# Patient Record
Sex: Female | Born: 1940 | Race: White | Hispanic: No | Marital: Married | Smoking: Never smoker
Health system: Southern US, Community
[De-identification: ages and names within clinical notes are randomized; demographics above are authoritative.]

## PROBLEM LIST (undated history)

## (undated) DIAGNOSIS — M8580 Other specified disorders of bone density and structure, unspecified site: Secondary | ICD-10-CM

## (undated) DIAGNOSIS — M797 Fibromyalgia: Secondary | ICD-10-CM

## (undated) DIAGNOSIS — R011 Cardiac murmur, unspecified: Secondary | ICD-10-CM

## (undated) DIAGNOSIS — E7800 Pure hypercholesterolemia, unspecified: Secondary | ICD-10-CM

## (undated) DIAGNOSIS — C439 Malignant melanoma of skin, unspecified: Secondary | ICD-10-CM

## (undated) HISTORY — PX: CATARACT EXTRACTION: SUR2

## (undated) HISTORY — DX: Pure hypercholesterolemia, unspecified: E78.00

## (undated) HISTORY — PX: ABDOMINAL HYSTERECTOMY: SHX81

## (undated) HISTORY — DX: Other specified disorders of bone density and structure, unspecified site: M85.80

## (undated) HISTORY — DX: Cardiac murmur, unspecified: R01.1

## (undated) HISTORY — PX: OTHER SURGICAL HISTORY: SHX169

## (undated) HISTORY — PX: MELANOMA EXCISION: SHX5266

---

## 1998-11-06 ENCOUNTER — Ambulatory Visit (HOSPITAL_COMMUNITY): Admission: RE | Admit: 1998-11-06 | Discharge: 1998-11-06 | Payer: Self-pay | Admitting: Gynecology

## 1998-12-21 ENCOUNTER — Inpatient Hospital Stay (HOSPITAL_COMMUNITY): Admission: RE | Admit: 1998-12-21 | Discharge: 1998-12-23 | Payer: Self-pay | Admitting: Gynecology

## 1999-04-14 ENCOUNTER — Emergency Department (HOSPITAL_COMMUNITY): Admission: EM | Admit: 1999-04-14 | Discharge: 1999-04-14 | Payer: Self-pay | Admitting: Emergency Medicine

## 1999-12-20 ENCOUNTER — Other Ambulatory Visit: Admission: RE | Admit: 1999-12-20 | Discharge: 1999-12-20 | Payer: Self-pay | Admitting: Gynecology

## 2000-01-07 ENCOUNTER — Encounter: Admission: RE | Admit: 2000-01-07 | Discharge: 2000-01-07 | Payer: Self-pay | Admitting: Gynecology

## 2000-01-07 ENCOUNTER — Encounter: Payer: Self-pay | Admitting: Gynecology

## 2001-01-04 ENCOUNTER — Other Ambulatory Visit: Admission: RE | Admit: 2001-01-04 | Discharge: 2001-01-04 | Payer: Self-pay | Admitting: Gynecology

## 2001-01-08 ENCOUNTER — Encounter: Payer: Self-pay | Admitting: Gynecology

## 2001-01-08 ENCOUNTER — Encounter: Admission: RE | Admit: 2001-01-08 | Discharge: 2001-01-08 | Payer: Self-pay | Admitting: Gynecology

## 2001-09-11 ENCOUNTER — Ambulatory Visit (HOSPITAL_BASED_OUTPATIENT_CLINIC_OR_DEPARTMENT_OTHER): Admission: RE | Admit: 2001-09-11 | Discharge: 2001-09-11 | Payer: Self-pay | Admitting: General Surgery

## 2002-01-31 ENCOUNTER — Encounter: Payer: Self-pay | Admitting: Gynecology

## 2002-01-31 ENCOUNTER — Encounter: Admission: RE | Admit: 2002-01-31 | Discharge: 2002-01-31 | Payer: Self-pay | Admitting: Gynecology

## 2002-12-25 ENCOUNTER — Other Ambulatory Visit: Admission: RE | Admit: 2002-12-25 | Discharge: 2002-12-25 | Payer: Self-pay | Admitting: Gynecology

## 2004-02-13 ENCOUNTER — Encounter: Admission: RE | Admit: 2004-02-13 | Discharge: 2004-02-13 | Payer: Self-pay | Admitting: Gynecology

## 2005-04-09 ENCOUNTER — Emergency Department (HOSPITAL_COMMUNITY): Admission: EM | Admit: 2005-04-09 | Discharge: 2005-04-09 | Payer: Self-pay | Admitting: Emergency Medicine

## 2008-07-29 ENCOUNTER — Encounter: Admission: RE | Admit: 2008-07-29 | Discharge: 2008-07-29 | Payer: Self-pay | Admitting: Family Medicine

## 2009-08-18 ENCOUNTER — Encounter: Admission: RE | Admit: 2009-08-18 | Discharge: 2009-08-18 | Payer: Self-pay | Admitting: Family Medicine

## 2010-09-09 ENCOUNTER — Encounter
Admission: RE | Admit: 2010-09-09 | Discharge: 2010-09-09 | Payer: Self-pay | Source: Home / Self Care | Attending: Family Medicine | Admitting: Family Medicine

## 2011-09-14 ENCOUNTER — Other Ambulatory Visit: Payer: Self-pay | Admitting: Family Medicine

## 2011-09-14 DIAGNOSIS — Z1231 Encounter for screening mammogram for malignant neoplasm of breast: Secondary | ICD-10-CM

## 2011-09-28 ENCOUNTER — Ambulatory Visit
Admission: RE | Admit: 2011-09-28 | Discharge: 2011-09-28 | Disposition: A | Discharge status: Home / Self Care | Payer: Medicare Other | Source: Ambulatory Visit | Attending: Family Medicine | Admitting: Family Medicine

## 2011-09-28 DIAGNOSIS — Z1231 Encounter for screening mammogram for malignant neoplasm of breast: Secondary | ICD-10-CM

## 2012-09-02 ENCOUNTER — Emergency Department (HOSPITAL_BASED_OUTPATIENT_CLINIC_OR_DEPARTMENT_OTHER)
Admission: EM | Admit: 2012-09-02 | Discharge: 2012-09-03 | Disposition: A | Discharge status: Home / Self Care | Payer: Medicare Other | Attending: Emergency Medicine | Admitting: Emergency Medicine

## 2012-09-02 ENCOUNTER — Emergency Department (HOSPITAL_BASED_OUTPATIENT_CLINIC_OR_DEPARTMENT_OTHER): Payer: Medicare Other

## 2012-09-02 ENCOUNTER — Encounter (HOSPITAL_BASED_OUTPATIENT_CLINIC_OR_DEPARTMENT_OTHER): Payer: Self-pay | Admitting: *Deleted

## 2012-09-02 DIAGNOSIS — J3489 Other specified disorders of nose and nasal sinuses: Secondary | ICD-10-CM | POA: Insufficient documentation

## 2012-09-02 DIAGNOSIS — J069 Acute upper respiratory infection, unspecified: Secondary | ICD-10-CM

## 2012-09-02 DIAGNOSIS — IMO0001 Reserved for inherently not codable concepts without codable children: Secondary | ICD-10-CM | POA: Insufficient documentation

## 2012-09-02 DIAGNOSIS — R5381 Other malaise: Secondary | ICD-10-CM | POA: Insufficient documentation

## 2012-09-02 DIAGNOSIS — R1110 Vomiting, unspecified: Secondary | ICD-10-CM | POA: Insufficient documentation

## 2012-09-02 DIAGNOSIS — B349 Viral infection, unspecified: Secondary | ICD-10-CM

## 2012-09-02 DIAGNOSIS — R112 Nausea with vomiting, unspecified: Secondary | ICD-10-CM | POA: Insufficient documentation

## 2012-09-02 DIAGNOSIS — B9789 Other viral agents as the cause of diseases classified elsewhere: Secondary | ICD-10-CM | POA: Insufficient documentation

## 2012-09-02 DIAGNOSIS — Z85820 Personal history of malignant melanoma of skin: Secondary | ICD-10-CM | POA: Insufficient documentation

## 2012-09-02 HISTORY — DX: Fibromyalgia: M79.7

## 2012-09-02 HISTORY — DX: Malignant melanoma of skin, unspecified: C43.9

## 2012-09-02 LAB — CBC WITH DIFFERENTIAL/PLATELET
Basophils Absolute: 0 10*3/uL (ref 0.0–0.1)
HCT: 38.1 % (ref 36.0–46.0)
Lymphocytes Relative: 5 % — ABNORMAL LOW (ref 12–46)
Monocytes Relative: 8 % (ref 3–12)
Neutro Abs: 13.7 10*3/uL — ABNORMAL HIGH (ref 1.7–7.7)
Platelets: 204 10*3/uL (ref 150–400)
RDW: 12.3 % (ref 11.5–15.5)
WBC: 15.8 10*3/uL — ABNORMAL HIGH (ref 4.0–10.5)

## 2012-09-02 LAB — BASIC METABOLIC PANEL
GFR calc Af Amer: >90 mL/min (ref >90)
GFR calc non Af Amer: 88 mL/min — ABNORMAL LOW (ref >90)
Potassium: 4.2 mEq/L (ref 3.5–5.1)
Sodium: 135 mEq/L (ref 135–145)

## 2012-09-02 MED ORDER — SODIUM CHLORIDE 0.9 % IV BOLUS (SEPSIS)
500.0000 mL | Freq: Once | INTRAVENOUS | Status: AC
  Administered 2012-09-02: 500 mL via INTRAVENOUS

## 2012-09-02 MED ORDER — SODIUM CHLORIDE 0.9 % IV SOLN: INTRAVENOUS | Status: DC

## 2012-09-02 MED ORDER — ONDANSETRON 4 MG PO TBDP: 4.0000 mg | ORAL_TABLET | Freq: Three times a day (TID) | ORAL | Status: DC | PRN

## 2012-09-02 MED ORDER — MECLIZINE HCL 25 MG PO TABS: 25.0000 mg | ORAL_TABLET | Freq: Four times a day (QID) | ORAL | Status: DC

## 2012-09-02 MED ORDER — MECLIZINE HCL 25 MG PO TABS
25.0000 mg | ORAL_TABLET | Freq: Once | ORAL | Status: AC
  Administered 2012-09-02: 25 mg via ORAL
  Filled 2012-09-02: qty 1

## 2012-09-02 MED ORDER — OSELTAMIVIR PHOSPHATE 75 MG PO CAPS: 75.0000 mg | ORAL_CAPSULE | Freq: Two times a day (BID) | ORAL | Status: DC

## 2012-09-02 NOTE — ED Notes (Signed)
Urine specimen requested 

## 2012-09-02 NOTE — ED Provider Notes (Addendum)
History  This chart was scribed for Shelda Jakes, MD by Shari Heritage, ED Scribe. The patient was seen in room MH06/MH06. Patient's care was started at 2113.  CSN: [id number]  Arrival date & time 09/02/12  1901   First MD Initiated Contact with Patient 09/02/12 2113      Chief Complaint  Patient presents with  . Cough  . Emesis    Patient is a 72 y.o. female presenting with cough. The history is provided by the patient. No language interpreter was used.  Cough This is a new problem. The current episode started more than 2 days ago. The problem occurs constantly. The problem has been gradually improving. The cough is non-productive. There has been no fever. Associated symptoms include rhinorrhea. Pertinent negatives include no chest pain, no sore throat and no shortness of breath.    HPI Comments: Whitney West is a 72 y.o. female who presents to the Emergency Department complaining of gradually worsening, non-productive cough onset 5 days ago. There is associated rhinorrhea, congestion, nausea, emesis (x1) and fatigue. Patient states that today she started to "feel worse" and felt "worn out completely. Patient began vomiting today in triage. Patient denies fever, diarrhea, abdominal pain, body aches, rash, dysuria, neck pain, back pain, bleeding easily, or shortness of breath. Patient does not use oxygen at home. She did not have a flu shot this season. Patient has the medical history of melanoma and fibromyalgia. Patient does not smoke.   Past Medical History  Diagnosis Date  . Fibromyalgia   . Melanoma     Past Surgical History  Procedure Date  . Bunions   . Abdominal hysterectomy   . Melanoma excision     No family history on file.  History  Substance Use Topics  . Smoking status: Never Smoker   . Smokeless tobacco: Never Used  . Alcohol Use: No    OB History    Grav Para Term Preterm Abortions TAB SAB Ect Mult Living                  Review of Systems    Constitutional: Positive for fatigue. Negative for fever.  HENT: Positive for congestion and rhinorrhea. Negative for sore throat and neck pain.   Eyes: Negative for visual disturbance.  Respiratory: Positive for cough. Negative for shortness of breath.   Cardiovascular: Negative for chest pain.  Gastrointestinal: Positive for nausea. Negative for abdominal pain and diarrhea.  Genitourinary: Negative for dysuria.  Musculoskeletal: Negative for back pain.  Skin: Negative for rash.  Hematological: Does not bruise/bleed easily.    Allergies  Review of patient's allergies indicates no known allergies.  Home Medications   Current Outpatient Rx  Name  Route  Sig  Dispense  Refill  . ONE-DAILY MULTI VITAMINS PO TABS   Oral   Take 1 tablet by mouth daily.           Triage Vitals: BP 138/52  Pulse 61  Temp 97.6 F (36.4 C) (Oral)  Resp 20  Ht 5\' 6"  (1.676 m)  Wt 160 lb (72.576 kg)  BMI 25.82 kg/m2  SpO2 97%  Physical Exam  Constitutional: She is oriented to person, place, and time. She appears well-developed and well-nourished. No distress.  HENT:  Head: Normocephalic and atraumatic.  Mouth/Throat: Oropharynx is clear and moist. Mucous membranes are dry (mildly). No oropharyngeal exudate or posterior oropharyngeal erythema.  Eyes: Conjunctivae normal and EOM are normal. Pupils are equal, round, and reactive to light.  Neck: Neck supple.  Cardiovascular: Normal rate and regular rhythm.   No murmur heard. Pulmonary/Chest: Effort normal. No respiratory distress. She has no wheezes. She has rhonchi (bilateral). She has no rales.  Abdominal: Soft. Bowel sounds are normal. She exhibits no distension. There is no tenderness. There is no rebound and no guarding.  Musculoskeletal:       No swelling in ankles.  Lymphadenopathy:    She has no cervical adenopathy.  Neurological: She is alert and oriented to person, place, and time. No cranial nerve deficit.  Skin: Skin is warm and  dry. No rash noted.  Psychiatric: She has a normal mood and affect. Her behavior is normal.    ED Course  Procedures (including critical care time) DIAGNOSTIC STUDIES: Oxygen Saturation is 97% on room air, adequate by my interpretation.    COORDINATION OF CARE: 9:46 PM- Patient informed of current plan for treatment and evaluation and agrees with plan at this time.  Results for orders placed during the hospital encounter of 09/02/12  CBC WITH DIFFERENTIAL      Component Value Range   WBC 15.8 (*) 4.0 - 10.5 K/uL   RBC 4.14  3.87 - 5.11 MIL/uL   Hemoglobin 13.3  12.0 - 15.0 g/dL   HCT 81.1  91.4 - 78.2 %   MCV 92.0  78.0 - 100.0 fL   MCH 32.1  26.0 - 34.0 pg   MCHC 34.9  30.0 - 36.0 g/dL   RDW 95.6  21.3 - 08.6 %   Platelets 204  150 - 400 K/uL   Neutrophils Relative 87 (*) 43 - 77 %   Lymphocytes Relative 5 (*) 12 - 46 %   Monocytes Relative 8  3 - 12 %   Eosinophils Relative 0  0 - 5 %   Basophils Relative 0  0 - 1 %   Neutro Abs 13.7 (*) 1.7 - 7.7 K/uL   Lymphs Abs 0.8  0.7 - 4.0 K/uL   Monocytes Absolute 1.3 (*) 0.1 - 1.0 K/uL   Eosinophils Absolute 0.0  0.0 - 0.7 K/uL   Basophils Absolute 0.0  0.0 - 0.1 K/uL   WBC Morphology ATYPICAL LYMPHOCYTES     Smear Review PLATELET COUNT CONFIRMED BY SMEAR    BASIC METABOLIC PANEL      Component Value Range   Sodium 135  135 - 145 mEq/L   Potassium 4.2  3.5 - 5.1 mEq/L   Chloride 99  96 - 112 mEq/L   CO2 24  19 - 32 mEq/L   Glucose, Bld 150 (*) 70 - 99 mg/dL   BUN 17  6 - 23 mg/dL   Creatinine, Ser 5.78  0.50 - 1.10 mg/dL   Calcium 8.9  8.4 - 46.9 mg/dL   GFR calc non Af Amer 88 (*) >90 mL/min   GFR calc Af Amer >90  >90 mL/min  TROPONIN I      Component Value Range   Troponin I <0.30  <0.30 ng/mL    Date: 09/02/2012  Rate: 64  Rhythm: normal sinus rhythm  QRS Axis: normal  Intervals: normal  ST/T Wave abnormalities: nonspecific ST changes  Conduction Disutrbances:none  Narrative Interpretation:   Old EKG  Reviewed: none available     No results found.   1. Upper respiratory infection       MDM   Patient with upper respiratory type symptoms for the past 5 days associated with cough today started feeling worse more week some dizziness and  nausea and vomited once in triage no further vomiting no diarrhea. Patient with a leukocytosis no significant electrolyte abnormalities renal function is normal. EKG without acute changes troponin negative so no evidence of any acute cardiac event to explain the weakness. Chest x-ray is still pending which may be important in ruling out pneumonia. Urinalysis is still pending.  Chest x-ray negative for pneumonia. Patient now with some dizziness vertigo type symptoms. Will treat with Antivert patient's symptoms consistent with a viral process this may be the beginning of an early flu but no clear-cut flu symptoms at this point in time. Patient's note acute distress nontoxic can be discharged home close followup will be important or returning if she gets worse at all.    I personally performed the services described in this documentation, which was scribed in my presence. The recorded information has been reviewed and is accurate.     Shelda Jakes, MD 09/02/12 0981  Shelda Jakes, MD 09/02/12 [phone]

## 2012-09-02 NOTE — ED Notes (Signed)
Place on o2 at 2l for sat 91% on room air

## 2012-09-02 NOTE — ED Notes (Addendum)
Pt reports cough x 5 days- today c/o feeling weak, dizzy and nauseated- now vomiting in triage

## 2012-09-13 ENCOUNTER — Other Ambulatory Visit: Payer: Self-pay | Admitting: Family Medicine

## 2012-09-13 DIAGNOSIS — Z1231 Encounter for screening mammogram for malignant neoplasm of breast: Secondary | ICD-10-CM

## 2012-10-16 ENCOUNTER — Ambulatory Visit
Admission: RE | Admit: 2012-10-16 | Discharge: 2012-10-16 | Disposition: A | Discharge status: Home / Self Care | Payer: Medicare Other | Source: Ambulatory Visit | Attending: Family Medicine | Admitting: Family Medicine

## 2012-10-16 DIAGNOSIS — Z1231 Encounter for screening mammogram for malignant neoplasm of breast: Secondary | ICD-10-CM

## 2013-10-03 ENCOUNTER — Other Ambulatory Visit: Payer: Self-pay

## 2013-10-03 DIAGNOSIS — Z1231 Encounter for screening mammogram for malignant neoplasm of breast: Secondary | ICD-10-CM

## 2013-10-17 ENCOUNTER — Ambulatory Visit
Admission: RE | Admit: 2013-10-17 | Discharge: 2013-10-17 | Disposition: A | Discharge status: Home / Self Care | Payer: Medicare Other | Source: Ambulatory Visit

## 2013-10-17 DIAGNOSIS — Z1231 Encounter for screening mammogram for malignant neoplasm of breast: Secondary | ICD-10-CM

## 2014-01-08 ENCOUNTER — Other Ambulatory Visit: Payer: Self-pay | Admitting: Family Medicine

## 2014-01-08 DIAGNOSIS — M8580 Other specified disorders of bone density and structure, unspecified site: Secondary | ICD-10-CM

## 2014-01-17 ENCOUNTER — Ambulatory Visit
Admission: RE | Admit: 2014-01-17 | Discharge: 2014-01-17 | Disposition: A | Discharge status: Home / Self Care | Payer: Medicare Other | Source: Ambulatory Visit | Attending: Family Medicine | Admitting: Family Medicine

## 2014-01-17 DIAGNOSIS — M8580 Other specified disorders of bone density and structure, unspecified site: Secondary | ICD-10-CM

## 2014-09-23 ENCOUNTER — Other Ambulatory Visit: Payer: Self-pay

## 2014-09-23 DIAGNOSIS — Z1231 Encounter for screening mammogram for malignant neoplasm of breast: Secondary | ICD-10-CM

## 2014-10-20 ENCOUNTER — Ambulatory Visit
Admission: RE | Admit: 2014-10-20 | Discharge: 2014-10-20 | Disposition: A | Discharge status: Home / Self Care | Payer: Medicare Other | Source: Ambulatory Visit

## 2014-10-20 DIAGNOSIS — Z1231 Encounter for screening mammogram for malignant neoplasm of breast: Secondary | ICD-10-CM

## 2015-09-25 ENCOUNTER — Other Ambulatory Visit: Payer: Self-pay

## 2015-09-25 DIAGNOSIS — Z1231 Encounter for screening mammogram for malignant neoplasm of breast: Secondary | ICD-10-CM

## 2015-10-22 ENCOUNTER — Ambulatory Visit
Admission: RE | Admit: 2015-10-22 | Discharge: 2015-10-22 | Disposition: A | Discharge status: Home / Self Care | Payer: Medicare Other | Source: Ambulatory Visit

## 2015-10-22 DIAGNOSIS — Z1231 Encounter for screening mammogram for malignant neoplasm of breast: Secondary | ICD-10-CM

## 2016-09-22 ENCOUNTER — Other Ambulatory Visit: Payer: Self-pay | Admitting: Family Medicine

## 2016-09-22 DIAGNOSIS — Z1231 Encounter for screening mammogram for malignant neoplasm of breast: Secondary | ICD-10-CM

## 2016-10-24 ENCOUNTER — Ambulatory Visit
Admission: RE | Admit: 2016-10-24 | Discharge: 2016-10-24 | Disposition: A | Discharge status: Home / Self Care | Payer: Medicare Other | Source: Ambulatory Visit | Attending: Family Medicine | Admitting: Family Medicine

## 2016-10-24 DIAGNOSIS — Z1231 Encounter for screening mammogram for malignant neoplasm of breast: Secondary | ICD-10-CM

## 2017-05-23 ENCOUNTER — Other Ambulatory Visit: Payer: Self-pay | Admitting: Family Medicine

## 2017-05-23 DIAGNOSIS — M8580 Other specified disorders of bone density and structure, unspecified site: Secondary | ICD-10-CM

## 2017-05-24 ENCOUNTER — Telehealth: Payer: Self-pay

## 2017-05-24 NOTE — Telephone Encounter (Signed)
SENT NOTES TO SCHEDULING 

## 2017-05-25 ENCOUNTER — Ambulatory Visit
Admission: RE | Admit: 2017-05-25 | Discharge: 2017-05-25 | Disposition: A | Discharge status: Home / Self Care | Payer: Medicare Other | Source: Ambulatory Visit | Attending: Family Medicine | Admitting: Family Medicine

## 2017-05-25 DIAGNOSIS — M8580 Other specified disorders of bone density and structure, unspecified site: Secondary | ICD-10-CM

## 2017-05-29 NOTE — Telephone Encounter (Signed)
05/26/2017 Received faxed referral from [facility] for upcoming appointment with Dr.Jordan on 06/23/2017.  Records given to [facility].  cbr

## 2017-05-29 NOTE — Telephone Encounter (Deleted)
05/26/2017 Received fa

## 2017-06-08 ENCOUNTER — Encounter: Payer: Self-pay | Admitting: *Deleted

## 2017-06-18 NOTE — Progress Notes (Signed)
Cardiology Office Note   Date:  06/23/2017   ID:  [name], [name] [birth year], MRN [id number]  PCP:  Aletha Halim., PA-C  Cardiologist:   Keaisha Sublette Martinique, MD   Chief Complaint  Patient presents with  . New Patient (Initial Visit)  . Heart Murmur      History of Present Illness: Whitney West is a 76 y.o. female who is seen at the request of Bing Matter PA-C for evaluation of a cardiac murmur. She states she was told she had a heart murmur years ago but nothing was made of it. More recently noted to have a murmur on exam. BP elevated on doctor's visit but she states BP much better at home. She denies any chest pain, dyspnea, dizziness, syncope, edema, palpitations. Overall she feels well. She is under a lot of stress with her husband's illness. He had a stroke and is now at [place]. There is a lot of family stress around this and no matter what question I ask about her health the patient starts talking about her husband.     Past Medical History:  Diagnosis Date  . Fibromyalgia   . Heart murmur   . Hypercholesterolemia   . Melanoma ([place])   . Osteopenia     Past Surgical History:  Procedure Laterality Date  . ABDOMINAL HYSTERECTOMY    . bunions    . CATARACT EXTRACTION    . MELANOMA EXCISION       Current Outpatient Prescriptions  Medication Sig Dispense Refill  . Multiple Vitamin (MULTIVITAMIN) tablet Take 1 tablet by mouth daily.     No current facility-administered medications for this visit.     Allergies:   Patient has no known allergies.    Social History:  The patient  reports that she has never smoked. She has never used smokeless tobacco. She reports that she does not drink alcohol or use drugs.   Family History:  The patient's family history includes Bone cancer in her mother; Cancer in her father; Diabetes in her sister; Hyperlipidemia in her sister.    ROS:  Please see the history of present illness.   Otherwise, review of  systems are positive for none.   All other systems are reviewed and negative.    PHYSICAL EXAM: VS:  BP (!) 192/81   Pulse 62   Ht 5\' 4"  (1.626 m)   Wt 159 lb 3.2 oz (72.2 kg)   BMI 27.33 kg/m  , BMI Body mass index is 27.33 kg/m. GEN: Well nourished, well developed, in no acute distress  HEENT: normal  Neck: no JVD, carotid bruits, or masses Cardiac: RRR; there is a harsh gr 3-4 systolic murmur heard best in the RUSB radiating to apex and axilla and to carotids. Soft S2. No  rubs, or gallops,no edema  Respiratory:  clear to auscultation bilaterally, normal work of breathing GI: soft, nontender, nondistended, + BS MS: no deformity or atrophy  Skin: warm and dry, no rash Neuro:  Strength and sensation are intact Psych: euthymic mood, full affect   EKG:  EKG is ordered today. The ekg ordered today demonstrates NSR with LVH. I have personally reviewed and interpreted this study.    Recent Labs: No results found for requested labs within last 8760 hours.    Lipid Panel No results found for: CHOL, TRIG, HDL, CHOLHDL, VLDL, LDLCALC, LDLDIRECT    Wt Readings from Last 3 Encounters:  06/23/17 159 lb 3.2 oz (72.2 kg)  09/02/12 160 lb (72.6 kg)      Other studies Reviewed: Additional studies/ records that were reviewed today include:   Labs dated 05/23/17: Cholesterol 239, triglycerides 143, HDL 66, Non HDL 173. LDL 139. CMET normal. Normal CBC.    ASSESSMENT AND PLAN:  1.  Murmur consistent with Aortic stenosis. I think this is at least moderate and maybe severe. LVH on Ecg supports this. She is asymptomatic. Will schedule for an Echocardiogram to assess and I will follow up after. 2. Elevated BP without prior diagnosis of HTN. I think she probably does have HTN. Goal BP 130/80. She will monitor at home and if staying high would recommend medical therapy. Would recommend an ACEi or ARB as initial therapy. 3. Hypercholesterolemia. LDL 139. Dietary modification initially. May  need to consider statin depending on cardiac evaluation.   Current medicines are reviewed at length with the patient today.  The patient does not have concerns regarding medicines.  The following changes have been made:  no change  Labs/ tests ordered today include:   Orders Placed This Encounter  Procedures  . EKG 12-Lead  . ECHOCARDIOGRAM COMPLETE    Signed, Deldrick Linch Martinique, MD  06/23/2017 2:04 PM    [facility] [address] [place], [place], [address] Phone [phone], Fax [phone]

## 2017-06-23 ENCOUNTER — Telehealth: Payer: Self-pay | Admitting: Cardiology

## 2017-06-23 ENCOUNTER — Encounter: Payer: Self-pay | Admitting: Cardiology

## 2017-06-23 ENCOUNTER — Ambulatory Visit (INDEPENDENT_AMBULATORY_CARE_PROVIDER_SITE_OTHER): Payer: Medicare Other | Admitting: Cardiology

## 2017-06-23 VITALS — BP 192/81 | HR 62 | Ht 64.0 in | Wt 159.2 lb

## 2017-06-23 DIAGNOSIS — R011 Cardiac murmur, unspecified: Secondary | ICD-10-CM | POA: Diagnosis not present

## 2017-06-23 DIAGNOSIS — E7800 Pure hypercholesterolemia, unspecified: Secondary | ICD-10-CM | POA: Diagnosis not present

## 2017-06-23 DIAGNOSIS — R030 Elevated blood-pressure reading, without diagnosis of hypertension: Secondary | ICD-10-CM

## 2017-06-23 NOTE — Patient Instructions (Addendum)
We will schedule you for an Echocardiogram  Monitor your BP closely. Target 130/80.  Your physician recommends that you schedule a follow-up appointment in: after ECHO with Dr Martinique.

## 2017-07-05 ENCOUNTER — Other Ambulatory Visit: Payer: Self-pay

## 2017-07-05 ENCOUNTER — Ambulatory Visit (HOSPITAL_COMMUNITY): Payer: Medicare Other | Attending: Cardiovascular Disease

## 2017-07-05 DIAGNOSIS — I080 Rheumatic disorders of both mitral and aortic valves: Secondary | ICD-10-CM | POA: Diagnosis not present

## 2017-07-05 DIAGNOSIS — R609 Edema, unspecified: Secondary | ICD-10-CM | POA: Diagnosis not present

## 2017-07-05 DIAGNOSIS — R011 Cardiac murmur, unspecified: Secondary | ICD-10-CM | POA: Insufficient documentation

## 2017-07-05 DIAGNOSIS — R030 Elevated blood-pressure reading, without diagnosis of hypertension: Secondary | ICD-10-CM | POA: Insufficient documentation

## 2017-07-05 DIAGNOSIS — I700 Atherosclerosis of aorta: Secondary | ICD-10-CM | POA: Insufficient documentation

## 2017-07-05 DIAGNOSIS — I10 Essential (primary) hypertension: Secondary | ICD-10-CM | POA: Diagnosis not present

## 2017-07-05 DIAGNOSIS — E7800 Pure hypercholesterolemia, unspecified: Secondary | ICD-10-CM | POA: Insufficient documentation

## 2017-07-05 DIAGNOSIS — E785 Hyperlipidemia, unspecified: Secondary | ICD-10-CM | POA: Diagnosis not present

## 2017-07-06 ENCOUNTER — Telehealth: Payer: Self-pay | Admitting: Cardiology

## 2017-07-06 DIAGNOSIS — I350 Nonrheumatic aortic (valve) stenosis: Secondary | ICD-10-CM

## 2017-07-06 NOTE — Telephone Encounter (Signed)
°  New message ° °Pt verbalized that she is returning call for the rn  °

## 2017-07-10 NOTE — Telephone Encounter (Signed)
Returned call to patient echo results given.Scheduler will call to schedule repeat echo in 1 year.

## 2017-07-18 ENCOUNTER — Ambulatory Visit: Payer: Medicare Other | Admitting: Cardiology

## 2017-09-15 ENCOUNTER — Other Ambulatory Visit: Payer: Self-pay | Admitting: Family Medicine

## 2017-09-15 DIAGNOSIS — Z1231 Encounter for screening mammogram for malignant neoplasm of breast: Secondary | ICD-10-CM

## 2017-10-26 ENCOUNTER — Ambulatory Visit: Payer: Medicare Other

## 2017-11-01 ENCOUNTER — Ambulatory Visit
Admission: RE | Admit: 2017-11-01 | Discharge: 2017-11-01 | Disposition: A | Discharge status: Home / Self Care | Payer: Medicare Other | Source: Ambulatory Visit | Attending: Family Medicine | Admitting: Family Medicine

## 2017-11-01 DIAGNOSIS — Z1231 Encounter for screening mammogram for malignant neoplasm of breast: Secondary | ICD-10-CM

## 2017-12-01 ENCOUNTER — Ambulatory Visit
Admission: RE | Admit: 2017-12-01 | Discharge: 2017-12-01 | Disposition: A | Discharge status: Home / Self Care | Payer: Medicare Other | Source: Ambulatory Visit | Attending: Family Medicine | Admitting: Family Medicine

## 2017-12-01 ENCOUNTER — Other Ambulatory Visit: Payer: Self-pay | Admitting: Family Medicine

## 2017-12-01 DIAGNOSIS — L97322 Non-pressure chronic ulcer of left ankle with fat layer exposed: Secondary | ICD-10-CM

## 2017-12-06 ENCOUNTER — Encounter (HOSPITAL_BASED_OUTPATIENT_CLINIC_OR_DEPARTMENT_OTHER): Payer: Medicare Other

## 2017-12-06 ENCOUNTER — Encounter (HOSPITAL_BASED_OUTPATIENT_CLINIC_OR_DEPARTMENT_OTHER): Payer: Medicare Other | Attending: Physician Assistant

## 2017-12-06 DIAGNOSIS — L97322 Non-pressure chronic ulcer of left ankle with fat layer exposed: Secondary | ICD-10-CM | POA: Diagnosis not present

## 2017-12-06 DIAGNOSIS — M797 Fibromyalgia: Secondary | ICD-10-CM | POA: Diagnosis not present

## 2017-12-06 DIAGNOSIS — Z85820 Personal history of malignant melanoma of skin: Secondary | ICD-10-CM | POA: Diagnosis not present

## 2017-12-06 DIAGNOSIS — I872 Venous insufficiency (chronic) (peripheral): Secondary | ICD-10-CM | POA: Diagnosis not present

## 2017-12-08 ENCOUNTER — Ambulatory Visit (HOSPITAL_COMMUNITY)
Admission: RE | Admit: 2017-12-08 | Discharge: 2017-12-08 | Disposition: A | Discharge status: Home / Self Care | Payer: Medicare Other | Source: Ambulatory Visit | Attending: Vascular Surgery | Admitting: Vascular Surgery

## 2017-12-08 ENCOUNTER — Other Ambulatory Visit (HOSPITAL_BASED_OUTPATIENT_CLINIC_OR_DEPARTMENT_OTHER): Payer: Self-pay | Admitting: Internal Medicine

## 2017-12-08 DIAGNOSIS — I872 Venous insufficiency (chronic) (peripheral): Secondary | ICD-10-CM | POA: Insufficient documentation

## 2017-12-08 DIAGNOSIS — L03119 Cellulitis of unspecified part of limb: Secondary | ICD-10-CM | POA: Diagnosis not present

## 2017-12-08 DIAGNOSIS — L97322 Non-pressure chronic ulcer of left ankle with fat layer exposed: Secondary | ICD-10-CM | POA: Diagnosis not present

## 2017-12-08 DIAGNOSIS — L97921 Non-pressure chronic ulcer of unspecified part of left lower leg limited to breakdown of skin: Secondary | ICD-10-CM

## 2017-12-13 DIAGNOSIS — L97322 Non-pressure chronic ulcer of left ankle with fat layer exposed: Secondary | ICD-10-CM | POA: Diagnosis not present

## 2017-12-20 DIAGNOSIS — L97322 Non-pressure chronic ulcer of left ankle with fat layer exposed: Secondary | ICD-10-CM | POA: Diagnosis not present

## 2017-12-27 ENCOUNTER — Encounter (HOSPITAL_BASED_OUTPATIENT_CLINIC_OR_DEPARTMENT_OTHER): Payer: Medicare Other | Attending: Internal Medicine

## 2017-12-27 DIAGNOSIS — M797 Fibromyalgia: Secondary | ICD-10-CM | POA: Insufficient documentation

## 2017-12-27 DIAGNOSIS — L97322 Non-pressure chronic ulcer of left ankle with fat layer exposed: Secondary | ICD-10-CM | POA: Diagnosis not present

## 2017-12-27 DIAGNOSIS — I872 Venous insufficiency (chronic) (peripheral): Secondary | ICD-10-CM | POA: Diagnosis not present

## 2018-01-03 DIAGNOSIS — L97322 Non-pressure chronic ulcer of left ankle with fat layer exposed: Secondary | ICD-10-CM | POA: Diagnosis not present

## 2018-01-10 DIAGNOSIS — L97322 Non-pressure chronic ulcer of left ankle with fat layer exposed: Secondary | ICD-10-CM | POA: Diagnosis not present

## 2018-01-17 ENCOUNTER — Other Ambulatory Visit: Payer: Self-pay

## 2018-01-17 ENCOUNTER — Ambulatory Visit: Payer: Medicare Other | Admitting: Vascular Surgery

## 2018-01-17 ENCOUNTER — Other Ambulatory Visit: Payer: Self-pay | Admitting: Vascular Surgery

## 2018-01-17 ENCOUNTER — Ambulatory Visit (HOSPITAL_COMMUNITY)
Admission: RE | Admit: 2018-01-17 | Discharge: 2018-01-17 | Disposition: A | Discharge status: Home / Self Care | Payer: Medicare Other | Source: Ambulatory Visit | Attending: Vascular Surgery | Admitting: Vascular Surgery

## 2018-01-17 ENCOUNTER — Encounter: Payer: Self-pay | Admitting: Vascular Surgery

## 2018-01-17 VITALS — BP 160/70 | HR 59 | Temp 98.6°F | Resp 16 | Ht 64.0 in | Wt 162.0 lb

## 2018-01-17 DIAGNOSIS — I724 Aneurysm of artery of lower extremity: Secondary | ICD-10-CM

## 2018-01-17 DIAGNOSIS — R0989 Other specified symptoms and signs involving the circulatory and respiratory systems: Secondary | ICD-10-CM | POA: Diagnosis not present

## 2018-01-17 DIAGNOSIS — I872 Venous insufficiency (chronic) (peripheral): Secondary | ICD-10-CM | POA: Diagnosis not present

## 2018-01-17 NOTE — Progress Notes (Signed)
Patient name: Whitney West MRN: [id number] DOB: Apr 15, 1941 Sex: female   REASON FOR CONSULT:    Abnormal venous studies.  The consult is requested by Jeri Cos, PA.  HPI:   Whitney West is a pleasant 77 y.o. female, who developed an ulcer above her left medial malleolus in early 2019.  This was slow to heal and ultimately she went to the wound care center where she is undergone aggressive outpatient therapy and now the wound has almost completely healed.  However it took some time.  She is unaware of any previous history of DVT or phlebitis.  She denies any history of claudication or rest pain.  She has no real risk factors for peripheral vascular disease.  She denies any history of diabetes, hypertension, hypercholesterolemia, family history of premature cardiovascular disease, or tobacco use.  I reviewed the records from the referring office.  The patient was seen by the wound care center on 12/13/2017.  The patient is being treated for a venous ulcer.  Past Medical History:  Diagnosis Date  . Fibromyalgia   . Heart murmur   . Hypercholesterolemia   . Melanoma ([place])   . Osteopenia     Family History  Problem Relation Age of Onset  . Bone cancer Mother   . Cancer Father   . Diabetes Sister   . Hyperlipidemia Sister     SOCIAL HISTORY: Social History   Socioeconomic History  . Marital status: Married    Spouse name: Not on file  . Number of children: 2  . Years of education: Not on file  . Highest education level: Not on file  Occupational History  . Not on file  Social Needs  . Financial resource strain: Not on file  . Food insecurity:    Worry: Not on file    Inability: Not on file  . Transportation needs:    Medical: Not on file    Non-medical: Not on file  Tobacco Use  . Smoking status: Never Smoker  . Smokeless tobacco: Never Used  Substance and Sexual Activity  . Alcohol use: No  . Drug use: No  . Sexual activity: Not on file  Lifestyle  . Physical  activity:    Days per week: Not on file    Minutes per session: Not on file  . Stress: Not on file  Relationships  . Social connections:    Talks on phone: Not on file    Gets together: Not on file    Attends religious service: Not on file    Active member of club or organization: Not on file    Attends meetings of clubs or organizations: Not on file    Relationship status: Not on file  . Intimate partner violence:    Fear of current or ex partner: Not on file    Emotionally abused: Not on file    Physically abused: Not on file    Forced sexual activity: Not on file  Other Topics Concern  . Not on file  Social History Narrative  . Not on file    No Known Allergies  Current Outpatient Medications  Medication Sig Dispense Refill  . Multiple Vitamin (MULTIVITAMIN) tablet Take 1 tablet by mouth daily.     No current facility-administered medications for this visit.     REVIEW OF SYSTEMS:  [X]  denotes positive finding, [ ]  denotes negative finding Cardiac  Comments:  Chest pain or chest pressure:    Shortness of breath  upon exertion:    Short of breath when lying flat:    Irregular heart rhythm:        Vascular    Pain in calf, thigh, or hip brought on by ambulation:    Pain in feet at night that wakes you up from your sleep:     Blood clot in your veins:    Leg swelling:  x       Pulmonary    Oxygen at home:    Productive cough:     Wheezing:         Neurologic    Sudden weakness in arms or legs:     Sudden numbness in arms or legs:     Sudden onset of difficulty speaking or slurred speech:    Temporary loss of vision in one eye:     Problems with dizziness:         Gastrointestinal    Blood in stool:     Vomited blood:         Genitourinary    Burning when urinating:     Blood in urine:        Psychiatric    Major depression:         Hematologic    Bleeding problems:    Problems with blood clotting too easily:        Skin    Rashes or ulcers: x   left ankle      Constitutional    Fever or chills:     PHYSICAL EXAM:   Vitals:   01/17/18 0923  BP: (!) 160/70  Pulse: (!) 59  Resp: 16  Temp: 98.6 F (37 C)  TempSrc: Oral  SpO2: 97%  Weight: 162 lb (73.5 kg)  Height: 5\' 4"  (1.626 m)    GENERAL: The patient is a well-nourished female, in no acute distress. The vital signs are documented above. CARDIAC: There is a regular rate and rhythm.  She has a systolic murmur. VASCULAR: I do not detect carotid bruits. On the left side which is the side with the ulcer, she has a palpable femoral pulse.  I cannot palpate popliteal or pedal pulses.  She has a monophasic dorsalis pedis signal and posterior tibial signal with the Doppler. On the right side she has a palpable femoral pulse.  She has a prominent right popliteal pulse.  I cannot palpate pedal pulses.  She has a biphasic right posterior tibial signal with the Doppler and a monophasic dorsalis pedis signal. She has mild bilateral lower extremity swelling. She has some hyperpigmentation bilaterally consistent with chronic venous insufficiency. She has telangiectasias bilaterally. PULMONARY: There is good air exchange bilaterally without wheezing or rales. ABDOMEN: Soft and non-tender with normal pitched bowel sounds.  MUSCULOSKELETAL: There are no major deformities or cyanosis. NEUROLOGIC: No focal weakness or paresthesias are detected. SKIN:  the wound above her left medial malleolus measures about 3 mm in diameter and has almost completely healed.  There is dry eschar over this. PSYCHIATRIC: The patient has a normal affect.  DATA:    VENOUS DUPLEX: I have reviewed the venous duplex scan that was done on 12/21/2017.  On the left side there is no evidence of deep venous thrombosis or superficial venous thrombosis.  There is deep venous reflux involving the common femoral vein femoral vein and popliteal vein.  There is also reflux in the small saphenous vein at the saphenofemoral  till junction.  The great saphenous vein has diameters ranging from 0.27  at the knee to 0.38 in the proximal thigh.  DUPLEX RIGHT POPLITEAL ARTERY: This showed no evidence of a right popliteal artery aneurysm.  The maximum diameter of the artery was 0.8 cm.  MEDICAL ISSUES:   CHRONIC VENOUS INSUFFICIENCY: This patient has evidence of chronic venous insufficiency on exam and by duplex.  We have discussed the importance of intermittent leg elevation and the proper positioning for this.  I have also written her a prescription for knee-high compression stockings with a mild gradient.  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  She has monophasic arterial signals in the left foot.  If she develops significant rest pain with leg elevation then we would potentially need to consider further work-up of her peripheral vascular disease.  We have also discussed the importance of trying to avoid prolonged sitting and standing and to exercise as much as possible.  PROMINENT RIGHT POPLITEAL PULSE: Given that she had a prominent right popliteal pulse I did order a duplex to rule out a popliteal artery aneurysm.  The duplex scan showed no evidence of a right popliteal artery aneurysm.  Deitra Mayo [facility] [phone]

## 2018-07-11 ENCOUNTER — Ambulatory Visit (HOSPITAL_COMMUNITY): Payer: Medicare Other | Attending: Cardiology

## 2018-07-11 ENCOUNTER — Other Ambulatory Visit: Payer: Self-pay

## 2018-07-11 DIAGNOSIS — I350 Nonrheumatic aortic (valve) stenosis: Secondary | ICD-10-CM | POA: Diagnosis not present

## 2018-07-25 NOTE — Progress Notes (Signed)
Cardiology Office Note   Date:  07/30/2018   ID:  Whitney, Whitney West 1940-12-09, MRN [id number]  PCP:  Aletha Halim., PA-C  Cardiologist:   Amedio Bowlby [place], MD   Chief Complaint  Patient presents with  . Follow-up    yearly visit and discuss Echo results      History of Present Illness: Whitney West is a 77 y.o. female who is seen for follow up  of a cardiac murmur. She states she was told she had a heart murmur years ago but nothing was made of it. Last year noted to have a murmur on exam. BP elevated on doctor's visit but she states BP much better at home.  She is under a lot of stress with her husband's illness. He had a stroke and is now at [place]. There is a lot of family stress around this.  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 Normal LV function. Repeat Echo this year was really unchanged.   She was seen last spring by [facility] for evaluation of venous ulcer.   She denies any dyspnea, chest pain, palpitations, or edema. No dizziness.   Past Medical History:  Diagnosis Date  . Fibromyalgia   . Heart murmur   . Hypercholesterolemia   . Melanoma ([place])   . Osteopenia     Past Surgical History:  Procedure Laterality Date  . ABDOMINAL HYSTERECTOMY    . bunions    . CATARACT EXTRACTION    . MELANOMA EXCISION       Current Outpatient Medications  Medication Sig Dispense Refill  . losartan (COZAAR) 25 MG tablet Take 25 mg by mouth daily.    . Multiple Vitamin (MULTIVITAMIN) tablet Take 1 tablet by mouth daily.     No current facility-administered medications for this visit.     Allergies:   Patient has no known allergies.    Social History:  The patient  reports that she has never smoked. She has never used smokeless tobacco. She reports that she does not drink alcohol or use drugs.   Family History:  The patient's family history includes Bone cancer in her mother; Cancer in her father; Diabetes in her  sister; Hyperlipidemia in her sister.    ROS:  Please see the history of present illness.   Otherwise, review of systems are positive for none.   All other systems are reviewed and negative.    PHYSICAL EXAM: VS:  BP (!) 164/88   Pulse (!) 55   Ht 5\' 5"  (1.651 m)   Wt 167 lb (75.8 kg)   BMI 27.79 kg/m  , BMI Body mass index is 27.79 kg/m. GEN: Well nourished, well developed, in no acute distress  HEENT: normal  Neck: no JVD, carotid bruits, or masses Cardiac: RRR; there is a harsh gr 3-4 systolic murmur heard best in the RUSB radiating to apex and axilla and to carotids. Soft S2. No  rubs, or gallops,no edema  Respiratory:  clear to auscultation bilaterally, normal work of breathing GI: soft, nontender, nondistended, + BS MS: no deformity or atrophy  Skin: warm and dry, no rash Neuro:  Strength and sensation are intact Psych: euthymic mood, full affect   EKG:  EKG is ordered today. The ekg ordered today demonstrates NSR with LVH. I have personally reviewed and interpreted this study.    Recent Labs: No results found for requested labs within last 8760 hours.    Lipid Panel  No results found for: CHOL, TRIG, HDL, CHOLHDL, VLDL, LDLCALC, LDLDIRECT    Wt Readings from Last 3 Encounters:  07/30/18 167 lb (75.8 kg)  01/17/18 162 lb (73.5 kg)  06/23/17 159 lb 3.2 oz (72.2 kg)      Other studies Reviewed: Additional studies/ records that were reviewed today include:   Labs dated 05/23/17: Cholesterol 239, triglycerides 143, HDL 66, Non HDL 173. LDL 139. CMET normal. Normal CBC.   Ecg today shows NSR rate 55. LVH. I have personally reviewed and interpreted this study.   Echo 07/11/18: Study Conclusions  - Left ventricle: The cavity size was normal. Systolic function was   normal. The estimated ejection fraction was in the range of 60%   to 65%. Wall motion was normal; there were no regional wall   motion abnormalities. Doppler parameters are consistent with    abnormal left ventricular relaxation (grade 1 diastolic   dysfunction). - Aortic valve: Noncoronary cusp mobility was severely restricted.   There was mild stenosis. There was mild to moderate   regurgitation. Mean gradient (S): 16 mm Hg. Regurgitation   pressure half-time: 446 ms. - Mitral valve: Calcified annulus. There was trivial regurgitation. - Right ventricle: Systolic function was normal. - Atrial septum: No defect or patent foramen ovale was identified. - Tricuspid valve: There was mild regurgitation. - Pulmonic valve: There was no significant regurgitation. - Pulmonary arteries: Systolic pressure was mildly increased. PA   peak pressure: 34 mm Hg (S).  Impressions:  - Normal LV EF with grade 1 diastolic dysfunction. Thickened aortic   valve with fixed noncoronary cusp. Since last echo, aortic   stenosis has progressed from mild to moderate by gradients.   Aortic regurgitation is mild to moderate.   ASSESSMENT AND PLAN:  1. Mild to moderate Aortic stenosis/insufficiency. Recent Echo really unchanged from one year ago. She is asymptomatic. Recommend follow up OV in one year and plan to repeat Echo in 2 years.  2.  HTN. Now started on low dose losartan. Managed by primary care.  3. Hypercholesterolemia. LDL 139. Dietary modification initially.    Current medicines are reviewed at length with the patient today.  The patient does not have concerns regarding medicines.  The following changes have been made:  no change  Labs/ tests ordered today include:   No orders of the defined types were placed in this encounter.  Follow up in one year.  Signed, Tram Wrenn [place], MD  07/30/2018 3:17 PM    [facility] [address], [place], [place], [address] Phone [phone], Fax [phone]

## 2018-07-30 ENCOUNTER — Encounter: Payer: Self-pay | Admitting: Cardiology

## 2018-07-30 ENCOUNTER — Ambulatory Visit: Payer: Medicare Other | Admitting: Cardiology

## 2018-07-30 VITALS — BP 164/88 | HR 55 | Ht 65.0 in | Wt 167.0 lb

## 2018-07-30 DIAGNOSIS — I10 Essential (primary) hypertension: Secondary | ICD-10-CM

## 2018-07-30 DIAGNOSIS — I350 Nonrheumatic aortic (valve) stenosis: Secondary | ICD-10-CM | POA: Diagnosis not present

## 2019-01-30 ENCOUNTER — Other Ambulatory Visit: Payer: Self-pay | Admitting: Family Medicine

## 2019-01-30 DIAGNOSIS — Z1231 Encounter for screening mammogram for malignant neoplasm of breast: Secondary | ICD-10-CM

## 2019-03-19 ENCOUNTER — Ambulatory Visit
Admission: RE | Admit: 2019-03-19 | Discharge: 2019-03-19 | Disposition: A | Discharge status: Home / Self Care | Payer: Medicare Other | Source: Ambulatory Visit | Attending: Family Medicine | Admitting: Family Medicine

## 2019-03-19 ENCOUNTER — Other Ambulatory Visit: Payer: Self-pay

## 2019-03-19 DIAGNOSIS — Z1231 Encounter for screening mammogram for malignant neoplasm of breast: Secondary | ICD-10-CM

## 2019-08-03 NOTE — Progress Notes (Signed)
Cardiology Office Note   Date:  08/05/2019   ID:  Whitney West, Spare [birth year], MRN [id number]  PCP:  Aletha Halim., PA-C  Cardiologist:   Sandar Krinke [place], MD   Chief Complaint  Patient presents with  . Aortic Stenosis      History of Present Illness: Whitney West is a 78 y.o. female who is seen for follow up  of a cardiac murmur. She states she was told she had a heart murmur years ago but nothing was made of it. In 2018 noted to have a murmur on exam.   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 Normal LV function. Repeat  Echo Nov 2019 was unchanged.   She denies any dyspnea, chest pain, palpitations, or edema. No dizziness. No other new health issues.  Past Medical History:  Diagnosis Date  . Fibromyalgia   . Heart murmur   . Hypercholesterolemia   . Melanoma ([place])   . Osteopenia     Past Surgical History:  Procedure Laterality Date  . ABDOMINAL HYSTERECTOMY    . bunions    . CATARACT EXTRACTION    . MELANOMA EXCISION       Current Outpatient Medications  Medication Sig Dispense Refill  . losartan (COZAAR) 25 MG tablet Take 25 mg by mouth daily.    . Multiple Vitamin (MULTIVITAMIN) tablet Take 1 tablet by mouth daily.    . Multiple Vitamins-Minerals (ANTIOXIDANT PO) Take by mouth daily. LIFEGUARD    . Multiple Vitamins-Minerals (EYE SUPPORT PO) Take by mouth daily.    Marland Kitchen RA Krill Oil 500 MG CAPS Take by mouth daily.     No current facility-administered medications for this visit.     Allergies:   Patient has no known allergies.    Social History:  The patient  reports that she has never smoked. She has never used smokeless tobacco. She reports that she does not drink alcohol or use drugs.   Family History:  The patient's family history includes Bone cancer in her mother; Cancer in her father; Diabetes in her sister; Hyperlipidemia in her sister.    ROS:  Please see the history of present illness.   Otherwise, review of systems are positive for  none.   All other systems are reviewed and negative.    PHYSICAL EXAM: VS:  BP 140/88   Pulse (!) 58   Ht 5\' 5"  (1.651 m)   Wt 163 lb (73.9 kg)   BMI 27.12 kg/m  , BMI Body mass index is 27.12 kg/m. GEN: Well nourished, well developed, in no acute distress  HEENT: normal  Neck: no JVD, carotid bruits, or masses Cardiac: RRR; there is a harsh gr 3-4 systolic murmur heard best in the RUSB radiating to apex and axilla and to carotids. Soft S2. No  rubs, or gallops,no edema  Respiratory:  clear to auscultation bilaterally, normal work of breathing GI: soft, nontender, nondistended, + BS MS: no deformity or atrophy  Skin: warm and dry, no rash Neuro:  Strength and sensation are intact Psych: euthymic mood, full affect   EKG:  EKG is ordered today. The ekg ordered today demonstrates NSR with LVH. Rate 58.  I have personally reviewed and interpreted this study.    Recent Labs: No results found for requested labs within last 8760 hours.    Lipid Panel No results found for: CHOL, TRIG, HDL, CHOLHDL, VLDL, LDLCALC, LDLDIRECT    Wt Readings from Last 3 Encounters:  08/05/19 163 lb (  73.9 kg)  07/30/18 167 lb (75.8 kg)  01/17/18 162 lb (73.5 kg)      Other studies Reviewed: Additional studies/ records that were reviewed today include:   Labs dated 05/23/17: Cholesterol 239, triglycerides 143, HDL 66, Non HDL 173. LDL 139. CMET normal. Normal CBC.     Echo 07/11/18: Study Conclusions  - Left ventricle: The cavity size was normal. Systolic function was   normal. The estimated ejection fraction was in the range of 60%   to 65%. Wall motion was normal; there were no regional wall   motion abnormalities. Doppler parameters are consistent with   abnormal left ventricular relaxation (grade 1 diastolic   dysfunction). - Aortic valve: Noncoronary cusp mobility was severely restricted.   There was mild stenosis. There was mild to moderate   regurgitation. Mean gradient (S): 16 mm  Hg. Regurgitation   pressure half-time: 446 ms. - Mitral valve: Calcified annulus. There was trivial regurgitation. - Right ventricle: Systolic function was normal. - Atrial septum: No defect or patent foramen ovale was identified. - Tricuspid valve: There was mild regurgitation. - Pulmonic valve: There was no significant regurgitation. - Pulmonary arteries: Systolic pressure was mildly increased. PA   peak pressure: 34 mm Hg (S).  Impressions:  - Normal LV EF with grade 1 diastolic dysfunction. Thickened aortic   valve with fixed noncoronary cusp. Since last echo, aortic   stenosis has progressed from mild to moderate by gradients.   Aortic regurgitation is mild to moderate.   ASSESSMENT AND PLAN:  1. Mild to moderate Aortic stenosis/insufficiency.  Echo in 2019 really unchanged from one year prior. She is asymptomatic. Recommend follow up OV in one year with Echo.  2.  HTN. Now started on low dose losartan. Reports good control at home.  3. Hypercholesterolemia. LDL 139. Dietary modification    Current medicines are reviewed at length with the patient today.  The patient does not have concerns regarding medicines.  The following changes have been made:  no change  Labs/ tests ordered today include:   No orders of the defined types were placed in this encounter.  Follow up in one year with Echo  Signed, Naysha Sholl [place], MD  08/05/2019 4:00 PM    [facility] [address], [place], [place], [address] Phone [phone], Fax [phone]

## 2019-08-05 ENCOUNTER — Encounter: Payer: Self-pay | Admitting: Cardiology

## 2019-08-05 ENCOUNTER — Ambulatory Visit: Payer: Medicare Other | Admitting: Cardiology

## 2019-08-05 ENCOUNTER — Other Ambulatory Visit: Payer: Self-pay

## 2019-08-05 VITALS — BP 140/88 | HR 58 | Ht 65.0 in | Wt 163.0 lb

## 2019-08-05 DIAGNOSIS — I10 Essential (primary) hypertension: Secondary | ICD-10-CM | POA: Diagnosis not present

## 2019-08-05 DIAGNOSIS — I350 Nonrheumatic aortic (valve) stenosis: Secondary | ICD-10-CM

## 2019-08-05 NOTE — Patient Instructions (Signed)
Medication Instructions:  Continue same medications *If you need a refill on your cardiac medications before your next appointment, please call your pharmacy*  Lab Work: None ordered  Testing/Procedures: Schedule Echo in 1 year  Follow-Up: At [facility], you and your health needs are our priority.  As part of our continuing mission to provide you with exceptional heart care, we have created designated Provider Care Teams.  These Care Teams include your primary Cardiologist (physician) and Advanced Practice Providers (APPs -  Physician Assistants and Nurse Practitioners) who all work together to provide you with the care you need, when you need it.  Your next appointment:  1 year   Call in Sept to schedule Dec appointment    The format for your next appointment:  Office   Provider:  Dr.Jordan

## 2019-08-05 NOTE — Addendum Note (Signed)
Addended by: Kathyrn Lass on: 08/05/2019 04:08 PM   Modules accepted: Orders

## 2019-10-19 ENCOUNTER — Ambulatory Visit: Payer: Medicare HMO | Attending: Internal Medicine

## 2019-10-19 DIAGNOSIS — Z23 Encounter for immunization: Secondary | ICD-10-CM

## 2019-10-19 NOTE — Progress Notes (Signed)
   Covid-19 Vaccination Clinic  Name:  Whitney West    MRN: [id number] DOB: 01-28-1941  10/19/2019  Whitney West was observed post Covid-19 immunization for 15 minutes without incidence. She was provided with Vaccine Information Sheet and instruction to access the V-Safe system.   Whitney West was instructed to call 911 with any severe reactions post vaccine: Marland Kitchen Difficulty breathing  . Swelling of your face and throat  . A fast heartbeat  . A bad rash all over your body  . Dizziness and weakness    Immunizations Administered    Name Date Dose VIS Date Route   Pfizer COVID-19 Vaccine 10/19/2019  9:14 AM 0.3 mL 08/09/2019 Intramuscular   Manufacturer: [place]   Lot: [id number]   [place]: [id number]

## 2019-11-09 IMAGING — MG DIGITAL SCREENING BILATERAL MAMMOGRAM WITH TOMO AND CAD
8 series · 8 of 24 positions shown · non-contrast
Comparison: Previous exam(s).

CLINICAL DATA: Screening.

EXAM:
DIGITAL SCREENING BILATERAL MAMMOGRAM WITH TOMO AND CAD

[R MLO synth-2D]
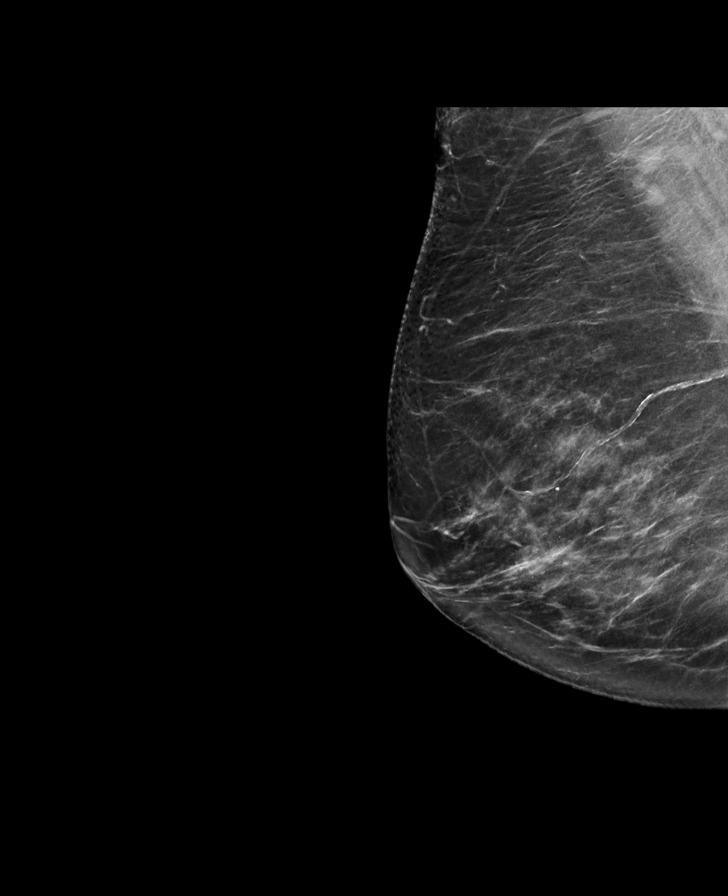

[R CC synth-2D]
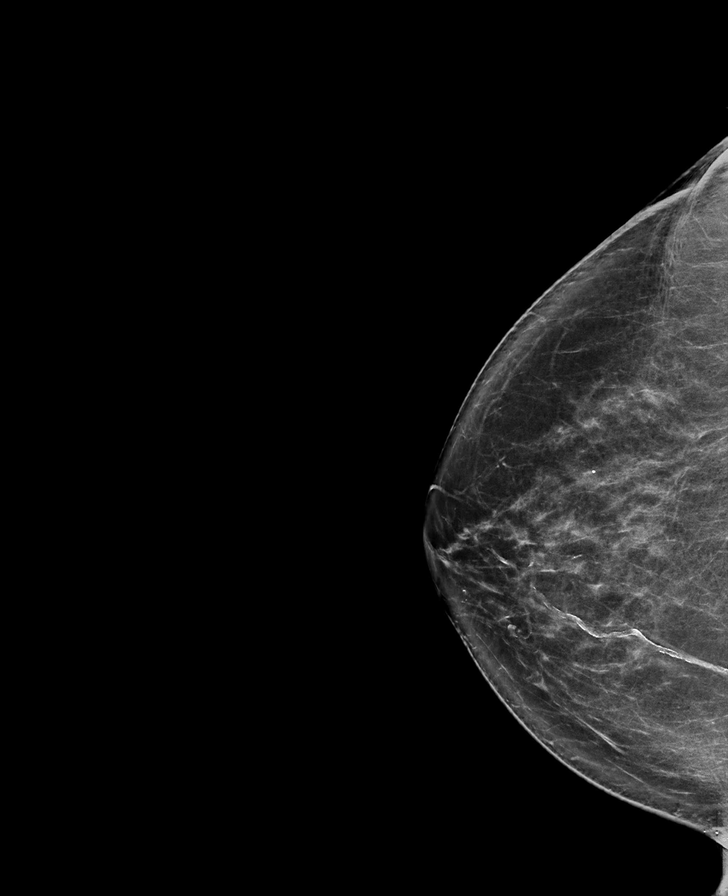

[L MLO synth-2D]
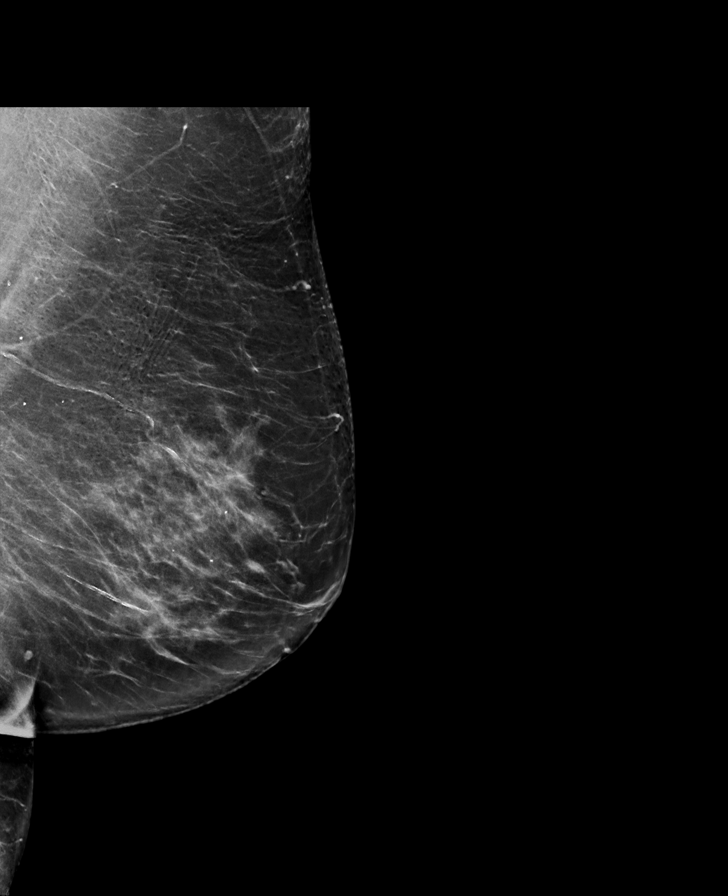

[L CC synth-2D]
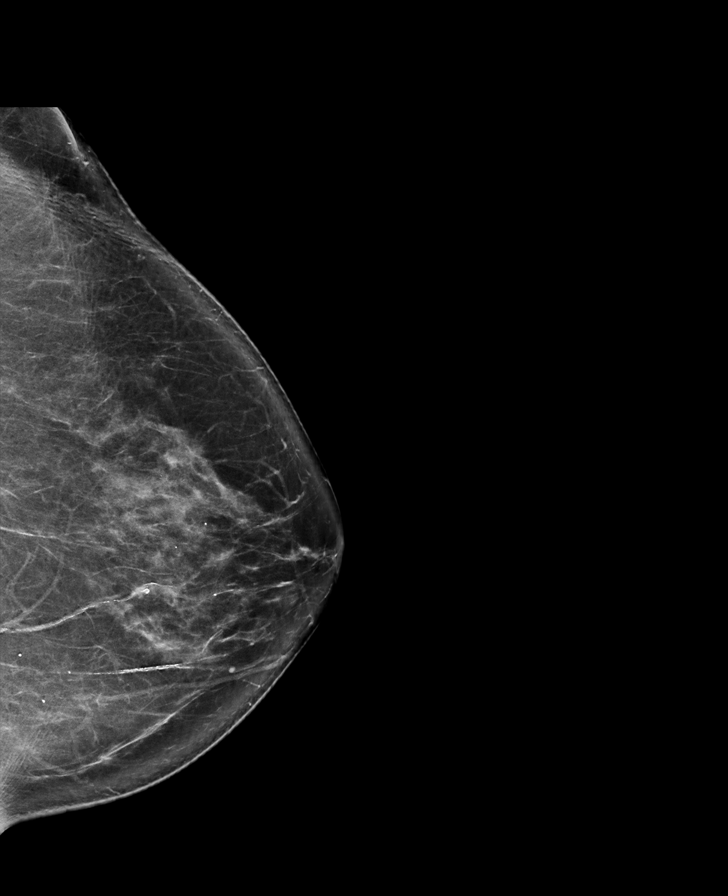

[L CC tomo · tomo slice 41/82.0]
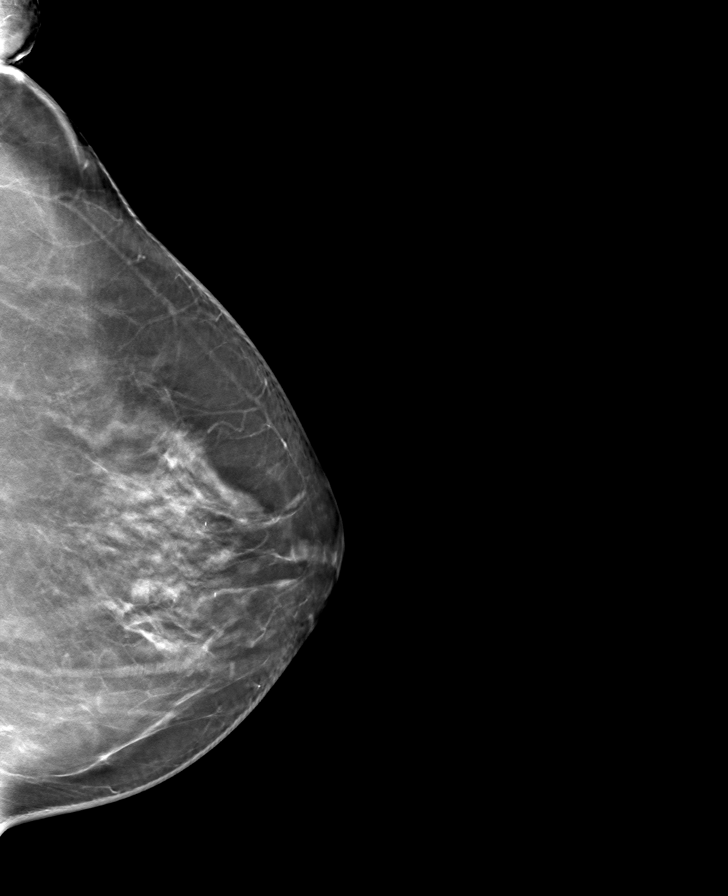

[R MLO tomo · tomo slice 40/79.0]
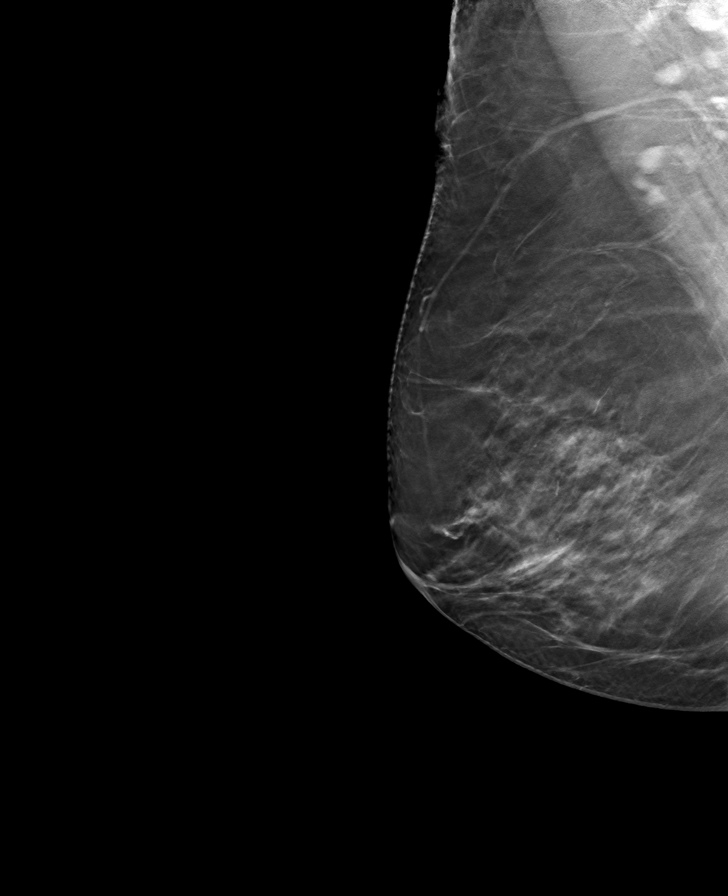

[L MLO tomo · tomo slice 44/87.0]
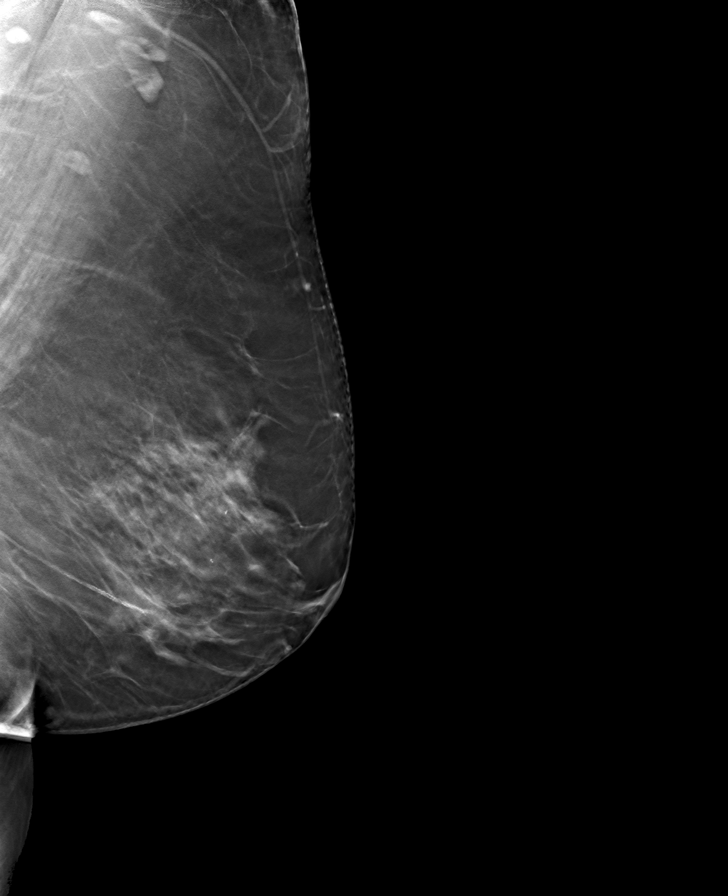

[R CC tomo · tomo slice 41/80.0]
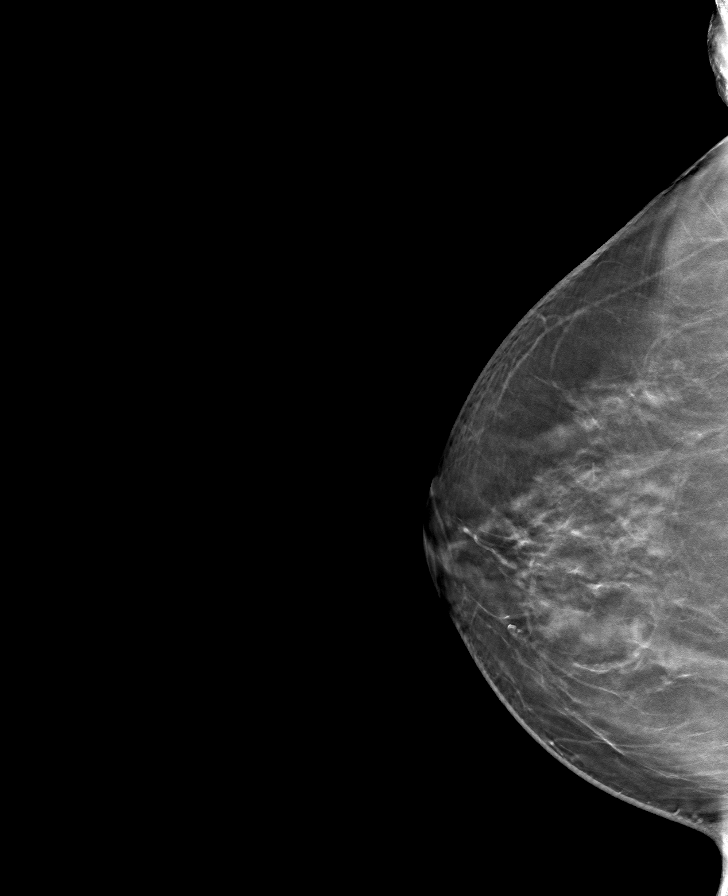

[8 of 24 positions shown; findings below may reference images not displayed]

ACR Breast Density Category b: There are scattered areas of
fibroglandular density.
FINDINGS: There are no findings suspicious for malignancy. Images were
processed with CAD.
IMPRESSION: No mammographic evidence of malignancy. A result letter of this
screening mammogram will be mailed directly to the patient.

RECOMMENDATION:
Screening mammogram in one year. (Code:CN-U-775)

BI-RADS CATEGORY  1: Negative.

## 2019-11-11 ENCOUNTER — Ambulatory Visit: Payer: Medicare HMO | Attending: Internal Medicine

## 2019-11-11 DIAGNOSIS — Z23 Encounter for immunization: Secondary | ICD-10-CM

## 2019-11-11 NOTE — Progress Notes (Signed)
   Covid-19 Vaccination Clinic  Name:  Whitney West    MRN: [id number] DOB: 12/02/1940  11/11/2019  Whitney West was observed post Covid-19 immunization for 15 minutes without incident. She was provided with Vaccine Information Sheet and instruction to access the V-Safe system.   Whitney West was instructed to call 911 with any severe reactions post vaccine: Marland Kitchen Difficulty breathing  . Swelling of face and throat  . A fast heartbeat  . A bad rash all over body  . Dizziness and weakness   Immunizations Administered    Name Date Dose VIS Date Route   Pfizer COVID-19 Vaccine 11/11/2019  9:48 AM 0.3 mL 08/09/2019 Intramuscular   Manufacturer: [place]   Lot: [id number]   [place]: [id number]

## 2020-05-01 ENCOUNTER — Other Ambulatory Visit: Payer: Self-pay | Admitting: Family Medicine

## 2020-05-01 DIAGNOSIS — Z1231 Encounter for screening mammogram for malignant neoplasm of breast: Secondary | ICD-10-CM

## 2020-05-06 ENCOUNTER — Other Ambulatory Visit: Payer: Self-pay

## 2020-05-06 ENCOUNTER — Ambulatory Visit
Admission: RE | Admit: 2020-05-06 | Discharge: 2020-05-06 | Disposition: A | Discharge status: Home / Self Care | Payer: Medicare HMO | Source: Ambulatory Visit | Attending: Family Medicine | Admitting: Family Medicine

## 2020-05-06 DIAGNOSIS — Z1231 Encounter for screening mammogram for malignant neoplasm of breast: Secondary | ICD-10-CM

## 2020-06-24 ENCOUNTER — Other Ambulatory Visit: Payer: Self-pay | Admitting: Family Medicine

## 2020-06-24 DIAGNOSIS — E2839 Other primary ovarian failure: Secondary | ICD-10-CM

## 2020-07-20 ENCOUNTER — Other Ambulatory Visit: Payer: Self-pay

## 2020-07-20 ENCOUNTER — Ambulatory Visit (HOSPITAL_COMMUNITY): Payer: Medicare HMO | Attending: Cardiology

## 2020-07-20 DIAGNOSIS — I10 Essential (primary) hypertension: Secondary | ICD-10-CM | POA: Insufficient documentation

## 2020-07-20 DIAGNOSIS — I350 Nonrheumatic aortic (valve) stenosis: Secondary | ICD-10-CM

## 2020-07-20 LAB — ECHOCARDIOGRAM COMPLETE
AR max vel: 1.17 cm2
AV Area VTI: 1.27 cm2
AV Area mean vel: 1.21 cm2
AV Mean grad: 15.5 mmHg
AV Peak grad: 28.1 mmHg
Ao pk vel: 2.65 m/s
Area-P 1/2: 1.72 cm2
P 1/2 time: 513 msec
S' Lateral: 2.5 cm

## 2020-08-01 NOTE — Progress Notes (Signed)
Cardiology Office Note   Date:  08/06/2020   ID:  Whitney, West 03/24/1941, MRN [id number]  PCP:  Aletha Halim., PA-C  Cardiologist:   Fany Cavanaugh [place], MD   Chief Complaint  Patient presents with  . Aortic Stenosis      History of Present Illness: Whitney West is a 79 y.o. female who is seen for follow up  of a cardiac murmur. She states she was told she had a heart murmur years ago but nothing was made of it. In 2018 noted to have a murmur on exam.   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 Normal LV function. Repeat  Echo Nov 2019 and Nov 2021 was unchanged.   She denies any dyspnea, chest pain, palpitations, or edema. No dizziness. No other new health issues. Her husband is now at [employer]. She stays active with her dog. Notes losartan dose was increased in September and BP has been doing better.   Past Medical History:  Diagnosis Date  . Fibromyalgia   . Heart murmur   . Hypercholesterolemia   . Melanoma ([place])   . Osteopenia     Past Surgical History:  Procedure Laterality Date  . ABDOMINAL HYSTERECTOMY    . bunions    . CATARACT EXTRACTION    . MELANOMA EXCISION       Current Outpatient Medications  Medication Sig Dispense Refill  . Multiple Vitamin (MULTIVITAMIN) tablet Take 1 tablet by mouth daily.    . Multiple Vitamins-Minerals (ANTIOXIDANT PO) Take by mouth daily. LIFEGUARD    . Multiple Vitamins-Minerals (EYE SUPPORT PO) Take by mouth daily.    Marland Kitchen RA Krill Oil 500 MG CAPS Take by mouth daily.    Marland Kitchen losartan (COZAAR) 50 MG tablet Take 1 tablet (50 mg total) by mouth daily. 90 tablet 3   No current facility-administered medications for this visit.    Allergies:   Patient has no known allergies.    Social History:  The patient  reports that she has never smoked. She has never used smokeless tobacco. She reports that she does not drink alcohol and does not use drugs.   Family History:  The patient's family history includes  Bone cancer in her mother; Cancer in her father; Diabetes in her sister; Hyperlipidemia in her sister.    ROS:  Please see the history of present illness.   Otherwise, review of systems are positive for none.   All other systems are reviewed and negative.    PHYSICAL EXAM: VS:  BP (!) 152/59   Pulse (!) 58   Ht 5\' 5"  (1.651 m)   Wt 157 lb 12.8 oz (71.6 kg)   SpO2 98%   BMI 26.26 kg/m  , BMI Body mass index is 26.26 kg/m. GEN: Well nourished, well developed, in no acute distress  HEENT: normal  Neck: no JVD, carotid bruits, or masses Cardiac: RRR; there is a harsh gr 3-4 systolic murmur heard best in the RUSB radiating to apex and axilla and to carotids. Soft S2. No  rubs, or gallops,no edema  Respiratory:  clear to auscultation bilaterally, normal work of breathing GI: soft, nontender, nondistended, + BS MS: no deformity or atrophy  Skin: warm and dry, no rash Neuro:  Strength and sensation are intact Psych: euthymic mood, full affect   EKG:  EKG is ordered today. The ekg ordered today demonstrates NSR with LVH. Rate 58.  LAD. I have personally reviewed and interpreted this study.  Recent Labs: No results found for requested labs within last 8760 hours.    Lipid Panel No results found for: CHOL, TRIG, HDL, CHOLHDL, VLDL, LDLCALC, LDLDIRECT    Wt Readings from Last 3 Encounters:  08/06/20 157 lb 12.8 oz (71.6 kg)  08/05/19 163 lb (73.9 kg)  07/30/18 167 lb (75.8 kg)      Other studies Reviewed: Additional studies/ records that were reviewed today include:   Labs dated 05/23/17: Cholesterol 239, triglycerides 143, HDL 66, Non HDL 173. LDL 139. CMET normal. Normal CBC.  Dated 06/24/20: Normal CMET and CBC. Cholesterol 195, triglycerides 145, HDL 54, LDL 130.    Echo 07/11/18: Study Conclusions  - Left ventricle: The cavity size was normal. Systolic function was   normal. The estimated ejection fraction was in the range of 60%   to 65%. Wall motion was normal;  there were no regional wall   motion abnormalities. Doppler parameters are consistent with   abnormal left ventricular relaxation (grade 1 diastolic   dysfunction). - Aortic valve: Noncoronary cusp mobility was severely restricted.   There was mild stenosis. There was mild to moderate   regurgitation. Mean gradient (S): 16 mm Hg. Regurgitation   pressure half-time: 446 ms. - Mitral valve: Calcified annulus. There was trivial regurgitation. - Right ventricle: Systolic function was normal. - Atrial septum: No defect or patent foramen ovale was identified. - Tricuspid valve: There was mild regurgitation. - Pulmonic valve: There was no significant regurgitation. - Pulmonary arteries: Systolic pressure was mildly increased. PA   peak pressure: 34 mm Hg (S).  Impressions:  - Normal LV EF with grade 1 diastolic dysfunction. Thickened aortic   valve with fixed noncoronary cusp. Since last echo, aortic   stenosis has progressed from mild to moderate by gradients.   Aortic regurgitation is mild to moderate.  Echo 07/20/20: IMPRESSIONS    1. Left ventricular ejection fraction, by estimation, is 60 to 65%. The  left ventricle has normal function. The left ventricle has no regional  wall motion abnormalities. There is mild left ventricular hypertrophy.  Left ventricular diastolic parameters  are consistent with Grade I diastolic dysfunction (impaired relaxation).  2. Right ventricular systolic function is normal. The right ventricular  size is normal. There is normal pulmonary artery systolic pressure.  3. The mitral valve is normal in structure. No evidence of mitral valve  regurgitation. No evidence of mitral stenosis. Moderate mitral annular  calcification.  4. The aortic valve has an indeterminant number of cusps. There is severe  calcifcation of the aortic valve. There is severe thickening of the aortic  valve. Aortic valve regurgitation is mild. Mild aortic valve stenosis.   Aortic regurgitation PHT measures  513 msec. Aortic valve area, by VTI measures 1.27 cm. Aortic valve mean  gradient measures 15.5 mmHg. Aortic valve Vmax measures 2.65 m/s.  5. Aortic dilatation noted. There is mild dilatation of the ascending  aorta, measuring 38 mm.  6. The inferior vena cava is normal in size with greater than 50%  respiratory variability, suggesting right atrial pressure of 3 mmHg.   Comparison(s): No significant change from prior study. Prior images  reviewed side by side. Prior mean AV gradient 70mmHg.    ASSESSMENT AND PLAN:  1. Mild to moderate Aortic stenosis/insufficiency.  Echo in 2019 really unchanged from one year prior. Repeat this November also unchanged. She is asymptomatic. Recommend follow up OV in one year. Can repeat Echo in 2 years given stability.  2.  HTN. On  losartan 50 mg daily. 3. Hypercholesterolemia. LDL 130. Dietary modification    Current medicines are reviewed at length with the patient today.  The patient does not have concerns regarding medicines.  The following changes have been made:  no change  Labs/ tests ordered today include:   Orders Placed This Encounter  Procedures  . EKG 12-Lead   Follow up in one year  Signed, Dashton Czerwinski [place], MD  08/06/2020 3:55 PM    [facility] [address], [place], [place], [address] Phone [phone], Fax [phone]

## 2020-08-06 ENCOUNTER — Encounter: Payer: Self-pay | Admitting: Cardiology

## 2020-08-06 ENCOUNTER — Ambulatory Visit: Payer: Medicare HMO | Admitting: Cardiology

## 2020-08-06 ENCOUNTER — Other Ambulatory Visit: Payer: Self-pay

## 2020-08-06 VITALS — BP 152/59 | HR 58 | Ht 65.0 in | Wt 157.8 lb

## 2020-08-06 DIAGNOSIS — I10 Essential (primary) hypertension: Secondary | ICD-10-CM

## 2020-08-06 DIAGNOSIS — I350 Nonrheumatic aortic (valve) stenosis: Secondary | ICD-10-CM

## 2020-08-06 MED ORDER — LOSARTAN POTASSIUM 50 MG PO TABS: 50.0000 mg | ORAL_TABLET | Freq: Every day | ORAL | 3 refills | Status: DC

## 2020-08-07 ENCOUNTER — Ambulatory Visit
Admission: RE | Admit: 2020-08-07 | Discharge: 2020-08-07 | Disposition: A | Discharge status: Home / Self Care | Payer: Medicare HMO | Source: Ambulatory Visit | Attending: Family Medicine | Admitting: Family Medicine

## 2020-08-07 DIAGNOSIS — E2839 Other primary ovarian failure: Secondary | ICD-10-CM

## 2021-06-08 ENCOUNTER — Other Ambulatory Visit: Payer: Self-pay | Admitting: Family Medicine

## 2021-06-08 DIAGNOSIS — Z1231 Encounter for screening mammogram for malignant neoplasm of breast: Secondary | ICD-10-CM

## 2021-06-25 ENCOUNTER — Ambulatory Visit
Admission: RE | Admit: 2021-06-25 | Discharge: 2021-06-25 | Disposition: A | Discharge status: Home / Self Care | Payer: Medicare HMO | Source: Ambulatory Visit | Attending: Family Medicine | Admitting: Family Medicine

## 2021-06-25 ENCOUNTER — Other Ambulatory Visit: Payer: Self-pay

## 2021-06-25 DIAGNOSIS — Z1231 Encounter for screening mammogram for malignant neoplasm of breast: Secondary | ICD-10-CM

## 2021-07-18 LAB — COLOGUARD: COLOGUARD: NEGATIVE

## 2021-07-18 LAB — EXTERNAL GENERIC LAB PROCEDURE: COLOGUARD: NEGATIVE

## 2021-10-07 NOTE — Progress Notes (Signed)
Cardiology Office Note   Date:  10/11/2021   ID:  Whitney, West 05-02-1941, MRN [id number]  PCP:  Whitney West., PA-C  Cardiologist:   Whitney Kuc Martinique, MD   Chief Complaint  Patient presents with   aortic insufficiency      History of Present Illness: Whitney West is a 81 y.o. female who is seen for follow up  of a cardiac murmur. She states she was told she had a heart murmur years ago but nothing was made of it. In 2018 noted to have a murmur on exam.   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 Normal LV function. Repeat  Echo Nov 2019 and Nov 2021 was unchanged.   She denies any dyspnea, chest pain, palpitations, or edema. No dizziness. No other new health issues. She stays active with her dog. BP has been well controlled.  Past Medical History:  Diagnosis Date   Fibromyalgia    Heart murmur    Hypercholesterolemia    Melanoma ([place])    Osteopenia     Past Surgical History:  Procedure Laterality Date   ABDOMINAL HYSTERECTOMY     bunions     CATARACT EXTRACTION     MELANOMA EXCISION       Current Outpatient Medications  Medication Sig Dispense Refill   Multiple Vitamin (MULTIVITAMIN) tablet Take 1 tablet by mouth daily.     Multiple Vitamins-Minerals (ANTIOXIDANT PO) Take by mouth daily. LIFEGUARD     Multiple Vitamins-Minerals (EYE SUPPORT PO) Take by mouth daily.     RA Krill Oil 500 MG CAPS Take by mouth daily.     losartan (COZAAR) 50 MG tablet Take 1 tablet (50 mg total) by mouth daily. 90 tablet 3   No current facility-administered medications for this visit.    Allergies:   Patient has no known allergies.    Social History:  The patient  reports that she has never smoked. She has never used smokeless tobacco. She reports that she does not drink alcohol and does not use drugs.   Family History:  The patient's family history includes Bone cancer in her mother; Cancer in her father; Diabetes in her sister; Hyperlipidemia in her  sister.    ROS:  Please see the history of present illness.   Otherwise, review of systems are positive for none.   All other systems are reviewed and negative.    PHYSICAL EXAM: VS:  BP 128/74    Pulse (!) 53    Ht 5\' 5"  (1.651 m)    Wt 154 lb (69.9 kg)    SpO2 98%    BMI 25.63 kg/m  , BMI Body mass index is 25.63 kg/m. GEN: Well nourished, well developed, in no acute distress  HEENT: normal  Neck: no JVD, carotid bruits, or masses Cardiac: RRR; there is a harsh gr 2/6 systolic murmur heard best in the RUSB radiating to apex and axilla and to carotids. Soft S2. No  rubs, or gallops,no edema  Respiratory:  clear to auscultation bilaterally, normal work of breathing GI: soft, nontender, nondistended, + BS MS: no deformity or atrophy  Skin: warm and dry, no rash Neuro:  Strength and sensation are intact Psych: euthymic mood, full affect   EKG:  EKG is ordered today. The ekg ordered today demonstrates NSR with rate 53.  Otherwise normal.  I have personally reviewed and interpreted this study.    Recent Labs: No results found for requested labs within last 8760  hours.    Lipid Panel No results found for: CHOL, TRIG, HDL, CHOLHDL, VLDL, LDLCALC, LDLDIRECT    Wt Readings from Last 3 Encounters:  10/11/21 154 lb (69.9 kg)  08/06/20 157 lb 12.8 oz (71.6 kg)  08/05/19 163 lb (73.9 kg)      Other studies Reviewed: Additional studies/ records that were reviewed today include:   Labs dated 05/23/17: Cholesterol 239, triglycerides 143, HDL 66, Non HDL 173. LDL 139. CMET normal. Normal CBC.  Dated 06/24/20: Normal CMET and CBC. Cholesterol 195, triglycerides 145, HDL 54, LDL 130.  Dated 06/30/21: cholesterol 207, triglycerides 117, HDL 59, LDL 139. CMET, CBC normal.    Echo 07/11/18: Study Conclusions   - Left ventricle: The cavity size was normal. Systolic function was   normal. The estimated ejection fraction was in the range of 60%   to 65%. Wall motion was normal; there were  no regional wall   motion abnormalities. Doppler parameters are consistent with   abnormal left ventricular relaxation (grade 1 diastolic   dysfunction). - Aortic valve: Noncoronary cusp mobility was severely restricted.   There was mild stenosis. There was mild to moderate   regurgitation. Mean gradient (S): 16 mm Hg. Regurgitation   pressure half-time: 446 ms. - Mitral valve: Calcified annulus. There was trivial regurgitation. - Right ventricle: Systolic function was normal. - Atrial septum: No defect or patent foramen ovale was identified. - Tricuspid valve: There was mild regurgitation. - Pulmonic valve: There was no significant regurgitation. - Pulmonary arteries: Systolic pressure was mildly increased. PA   peak pressure: 34 mm Hg (S).   Impressions:   - Normal LV EF with grade 1 diastolic dysfunction. Thickened aortic   valve with fixed noncoronary cusp. Since last echo, aortic   stenosis has progressed from mild to moderate by gradients.   Aortic regurgitation is mild to moderate.  Echo 07/20/20: IMPRESSIONS     1. Left ventricular ejection fraction, by estimation, is 60 to 65%. The  left ventricle has normal function. The left ventricle has no regional  wall motion abnormalities. There is mild left ventricular hypertrophy.  Left ventricular diastolic parameters  are consistent with Grade I diastolic dysfunction (impaired relaxation).   2. Right ventricular systolic function is normal. The right ventricular  size is normal. There is normal pulmonary artery systolic pressure.   3. The mitral valve is normal in structure. No evidence of mitral valve  regurgitation. No evidence of mitral stenosis. Moderate mitral annular  calcification.   4. The aortic valve has an indeterminant number of cusps. There is severe  calcifcation of the aortic valve. There is severe thickening of the aortic  valve. Aortic valve regurgitation is mild. Mild aortic valve stenosis.  Aortic  regurgitation PHT measures   513 msec. Aortic valve area, by VTI measures 1.27 cm. Aortic valve mean  gradient measures 15.5 mmHg. Aortic valve Vmax measures 2.65 m/s.   5. Aortic dilatation noted. There is mild dilatation of the ascending  aorta, measuring 38 mm.   6. The inferior vena cava is normal in size with greater than 50%  respiratory variability, suggesting right atrial pressure of 3 mmHg.   Comparison(s): No significant change from prior study. Prior images  reviewed side by side. Prior mean AV gradient 69mmHg.    ASSESSMENT AND PLAN:  1. Mild to moderate Aortic stenosis/insufficiency.  Echo in 2021 really unchanged from one year prior.  She is asymptomatic. Recommend follow up OV in one year with repeat Echo at  that time. 2.  HTN. Well controlled on losartan. 3. Hypercholesterolemia. LDL 139. Dietary modification    Current medicines are reviewed at length with the patient today.  The patient does not have concerns regarding medicines.  The following changes have been made:  no change  Labs/ tests ordered today include:   No orders of the defined types were placed in this encounter.  Follow up in one year with Echo.  Signed, Altheria Shadoan Martinique, MD  10/11/2021 9:19 AM    [facility] [address] [place], [place], [address] Phone [phone], Fax [phone]

## 2021-10-11 ENCOUNTER — Other Ambulatory Visit: Payer: Self-pay

## 2021-10-11 ENCOUNTER — Encounter: Payer: Self-pay | Admitting: Cardiology

## 2021-10-11 ENCOUNTER — Ambulatory Visit: Payer: Medicare HMO | Admitting: Cardiology

## 2021-10-11 VITALS — BP 128/74 | HR 53 | Ht 65.0 in | Wt 154.0 lb

## 2021-10-11 DIAGNOSIS — I350 Nonrheumatic aortic (valve) stenosis: Secondary | ICD-10-CM | POA: Diagnosis not present

## 2021-10-11 DIAGNOSIS — I351 Nonrheumatic aortic (valve) insufficiency: Secondary | ICD-10-CM | POA: Diagnosis not present

## 2021-10-11 DIAGNOSIS — I10 Essential (primary) hypertension: Secondary | ICD-10-CM | POA: Diagnosis not present

## 2021-10-27 DIAGNOSIS — D485 Neoplasm of uncertain behavior of skin: Secondary | ICD-10-CM | POA: Diagnosis not present

## 2021-10-27 DIAGNOSIS — L820 Inflamed seborrheic keratosis: Secondary | ICD-10-CM | POA: Diagnosis not present

## 2022-05-13 ENCOUNTER — Other Ambulatory Visit: Payer: Self-pay | Admitting: Family Medicine

## 2022-05-13 DIAGNOSIS — Z1231 Encounter for screening mammogram for malignant neoplasm of breast: Secondary | ICD-10-CM

## 2022-05-30 DIAGNOSIS — H40013 Open angle with borderline findings, low risk, bilateral: Secondary | ICD-10-CM | POA: Diagnosis not present

## 2022-05-30 DIAGNOSIS — H0102B Squamous blepharitis left eye, upper and lower eyelids: Secondary | ICD-10-CM | POA: Diagnosis not present

## 2022-05-30 DIAGNOSIS — H35373 Puckering of macula, bilateral: Secondary | ICD-10-CM | POA: Diagnosis not present

## 2022-05-30 DIAGNOSIS — H35033 Hypertensive retinopathy, bilateral: Secondary | ICD-10-CM | POA: Diagnosis not present

## 2022-06-07 ENCOUNTER — Ambulatory Visit (HOSPITAL_COMMUNITY): Payer: Medicare HMO | Attending: Cardiology

## 2022-06-07 DIAGNOSIS — I350 Nonrheumatic aortic (valve) stenosis: Secondary | ICD-10-CM | POA: Diagnosis not present

## 2022-06-07 DIAGNOSIS — I351 Nonrheumatic aortic (valve) insufficiency: Secondary | ICD-10-CM | POA: Diagnosis not present

## 2022-06-07 DIAGNOSIS — I10 Essential (primary) hypertension: Secondary | ICD-10-CM | POA: Diagnosis not present

## 2022-06-07 LAB — ECHOCARDIOGRAM COMPLETE
AR max vel: 1.36 cm2
AV Area VTI: 1.39 cm2
AV Area mean vel: 1.36 cm2
AV Mean grad: 15 mmHg
AV Peak grad: 27.5 mmHg
Ao pk vel: 2.62 m/s
Area-P 1/2: 2.67 cm2
P 1/2 time: 405 msec
S' Lateral: 3.3 cm

## 2022-06-27 ENCOUNTER — Ambulatory Visit
Admission: RE | Admit: 2022-06-27 | Discharge: 2022-06-27 | Disposition: A | Discharge status: Home / Self Care | Payer: Medicare HMO | Source: Ambulatory Visit | Attending: Family Medicine | Admitting: Family Medicine

## 2022-06-27 DIAGNOSIS — Z1231 Encounter for screening mammogram for malignant neoplasm of breast: Secondary | ICD-10-CM

## 2022-07-06 ENCOUNTER — Other Ambulatory Visit: Payer: Self-pay | Admitting: Family Medicine

## 2022-07-06 DIAGNOSIS — Z23 Encounter for immunization: Secondary | ICD-10-CM | POA: Diagnosis not present

## 2022-07-06 DIAGNOSIS — Z0000 Encounter for general adult medical examination without abnormal findings: Secondary | ICD-10-CM | POA: Diagnosis not present

## 2022-07-06 DIAGNOSIS — M810 Age-related osteoporosis without current pathological fracture: Secondary | ICD-10-CM

## 2022-07-06 DIAGNOSIS — I350 Nonrheumatic aortic (valve) stenosis: Secondary | ICD-10-CM | POA: Diagnosis not present

## 2022-07-06 DIAGNOSIS — I10 Essential (primary) hypertension: Secondary | ICD-10-CM | POA: Diagnosis not present

## 2022-09-28 NOTE — Progress Notes (Signed)
Cardiology Office Note   Date:  10/07/2022   ID:  Whitney, West Sep 21, 1940, MRN [id number]  PCP:  Aletha Halim., PA-C  Cardiologist:   Finbar Nippert Martinique, MD   Chief Complaint  Patient presents with   Aortic Stenosis      History of Present Illness: Whitney West is a 82 y.o. female who is seen for follow up  of a cardiac murmur. She states she was told she had a heart murmur years ago but nothing was made of it. In 2018 noted to have a murmur on exam.   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 Normal LV function. Repeat  Echo Nov 2019 and Nov 2021 was unchanged. Repeat in Oct 2023 was stable.  She denies any dyspnea, chest pain, palpitations, or edema. No dizziness. No other new health issues. She stays active with her dog. She reports BP has been well controlled.  Past Medical History:  Diagnosis Date   Fibromyalgia    Heart murmur    Hypercholesterolemia    Melanoma ([place])    Osteopenia     Past Surgical History:  Procedure Laterality Date   ABDOMINAL HYSTERECTOMY     bunions     CATARACT EXTRACTION     MELANOMA EXCISION       Current Outpatient Medications  Medication Sig Dispense Refill   losartan (COZAAR) 50 MG tablet Take 1 tablet (50 mg total) by mouth daily. 90 tablet 3   Multiple Vitamin (MULTIVITAMIN) tablet Take 1 tablet by mouth daily.     Multiple Vitamins-Minerals (ANTIOXIDANT PO) Take by mouth daily. LIFEGUARD     Multiple Vitamins-Minerals (EYE SUPPORT PO) Take by mouth daily.     RA Krill Oil 500 MG CAPS Take by mouth daily.     omega-3 acid ethyl esters (LOVAZA) 1 g capsule Take 2 g by mouth 2 (two) times daily.     No current facility-administered medications for this visit.    Allergies:   Patient has no known allergies.    Social History:  The patient  reports that she has never smoked. She has never used smokeless tobacco. She reports that she does not drink alcohol and does not use drugs.   Family History:  The  patient's family history includes Bone cancer in her mother; Cancer in her father; Diabetes in her sister; Hyperlipidemia in her sister.    ROS:  Please see the history of present illness.   Otherwise, review of systems are positive for none.   All other systems are reviewed and negative.    PHYSICAL EXAM: VS:  BP (!) 148/78 (BP Location: Right Arm, Patient Position: Sitting, Cuff Size: Normal)   Pulse (!) 59   Ht 5' 5"$  (1.651 m)   Wt 153 lb 9.6 oz (69.7 kg)   BMI 25.56 kg/m  , BMI Body mass index is 25.56 kg/m. GEN: Well nourished, well developed, in no acute distress  HEENT: normal  Neck: no JVD, carotid bruits, or masses Cardiac: RRR; there is a harsh gr 2/6 systolic murmur heard best in the RUSB radiating to apex and axilla and to carotids. Soft S2. No  rubs, or gallops,no edema  Respiratory:  clear to auscultation bilaterally, normal work of breathing GI: soft, nontender, nondistended, + BS MS: no deformity or atrophy  Skin: warm and dry, no rash Neuro:  Strength and sensation are intact Psych: euthymic mood, full affect   EKG:  EKG is ordered today. The ekg ordered  today demonstrates NSR with rate 59. LAD, LVH   I have personally reviewed and interpreted this study.    Recent Labs: No results found for requested labs within last 365 days.    Lipid Panel No results found for: "CHOL", "TRIG", "HDL", "CHOLHDL", "VLDL", "LDLCALC", "LDLDIRECT"    Wt Readings from Last 3 Encounters:  10/07/22 153 lb 9.6 oz (69.7 kg)  10/11/21 154 lb (69.9 kg)  08/06/20 157 lb 12.8 oz (71.6 kg)      Other studies Reviewed: Additional studies/ records that were reviewed today include:   Labs dated 05/23/17: Cholesterol 239, triglycerides 143, HDL 66, Non HDL 173. LDL 139. CMET normal. Normal CBC.  Dated 06/24/20: Normal CMET and CBC. Cholesterol 195, triglycerides 145, HDL 54, LDL 130.  Dated 06/30/21: cholesterol 207, triglycerides 117, HDL 59, LDL 139. CMET, CBC normal.    Echo  07/11/18: Study Conclusions   - Left ventricle: The cavity size was normal. Systolic function was   normal. The estimated ejection fraction was in the range of 60%   to 65%. Wall motion was normal; there were no regional wall   motion abnormalities. Doppler parameters are consistent with   abnormal left ventricular relaxation (grade 1 diastolic   dysfunction). - Aortic valve: Noncoronary cusp mobility was severely restricted.   There was mild stenosis. There was mild to moderate   regurgitation. Mean gradient (S): 16 mm Hg. Regurgitation   pressure half-time: 446 ms. - Mitral valve: Calcified annulus. There was trivial regurgitation. - Right ventricle: Systolic function was normal. - Atrial septum: No defect or patent foramen ovale was identified. - Tricuspid valve: There was mild regurgitation. - Pulmonic valve: There was no significant regurgitation. - Pulmonary arteries: Systolic pressure was mildly increased. PA   peak pressure: 34 mm Hg (S).   Impressions:   - Normal LV EF with grade 1 diastolic dysfunction. Thickened aortic   valve with fixed noncoronary cusp. Since last echo, aortic   stenosis has progressed from mild to moderate by gradients.   Aortic regurgitation is mild to moderate.  Echo 07/20/20: IMPRESSIONS     1. Left ventricular ejection fraction, by estimation, is 60 to 65%. The  left ventricle has normal function. The left ventricle has no regional  wall motion abnormalities. There is mild left ventricular hypertrophy.  Left ventricular diastolic parameters  are consistent with Grade I diastolic dysfunction (impaired relaxation).   2. Right ventricular systolic function is normal. The right ventricular  size is normal. There is normal pulmonary artery systolic pressure.   3. The mitral valve is normal in structure. No evidence of mitral valve  regurgitation. No evidence of mitral stenosis. Moderate mitral annular  calcification.   4. The aortic valve has an  indeterminant number of cusps. There is severe  calcifcation of the aortic valve. There is severe thickening of the aortic  valve. Aortic valve regurgitation is mild. Mild aortic valve stenosis.  Aortic regurgitation PHT measures   513 msec. Aortic valve area, by VTI measures 1.27 cm. Aortic valve mean  gradient measures 15.5 mmHg. Aortic valve Vmax measures 2.65 m/s.   5. Aortic dilatation noted. There is mild dilatation of the ascending  aorta, measuring 38 mm.   6. The inferior vena cava is normal in size with greater than 50%  respiratory variability, suggesting right atrial pressure of 3 mmHg.   Comparison(s): No significant change from prior study. Prior images  reviewed side by side. Prior mean AV gradient 46mHg.   Echo 06/07/22:  IMPRESSIONS     1. Calcified aortic valve with fixed noncoronary cusp; mild to moderate  AS (mean gradient 15 mmHg; AVA 1.4 cm2); mild AI.   2. Left ventricular ejection fraction, by estimation, is 60 to 65%. The  left ventricle has normal function. The left ventricle has no regional  wall motion abnormalities. Left ventricular diastolic parameters are  consistent with Grade I diastolic  dysfunction (impaired relaxation). Elevated left atrial pressure.   3. Right ventricular systolic function is normal. The right ventricular  size is normal. There is mildly elevated pulmonary artery systolic  pressure.   4. The mitral valve is normal in structure. Trivial mitral valve  regurgitation. No evidence of mitral stenosis.   5. The aortic valve is calcified. Aortic valve regurgitation is mild.  Mild to moderate aortic valve stenosis.   6. The inferior vena cava is normal in size with greater than 50%  respiratory variability, suggesting right atrial pressure of 3 mmHg.    ASSESSMENT AND PLAN:  1. Mild to moderate Aortic stenosis/insufficiency.  No significant change over 5 year period from 2018.  She is asymptomatic. Recommend follow up OV in one year.  I think we can forego repeat Echo at this point unless there is a change in her clinical status . 2.  HTN.  on losartan. 3. Hypercholesterolemia. LDL 139. Dietary modification     Signed, Huber Mathers Martinique, MD  10/07/2022 10:55 AM    [facility] [address] [place], [place], [address] Phone [phone], Fax [phone]

## 2022-10-07 ENCOUNTER — Ambulatory Visit: Payer: Medicare HMO | Attending: Cardiology | Admitting: Cardiology

## 2022-10-07 ENCOUNTER — Encounter: Payer: Self-pay | Admitting: Cardiology

## 2022-10-07 VITALS — BP 148/78 | HR 59 | Ht 65.0 in | Wt 153.6 lb

## 2022-10-07 DIAGNOSIS — I351 Nonrheumatic aortic (valve) insufficiency: Secondary | ICD-10-CM

## 2022-10-07 DIAGNOSIS — I350 Nonrheumatic aortic (valve) stenosis: Secondary | ICD-10-CM | POA: Diagnosis not present

## 2022-10-07 DIAGNOSIS — I10 Essential (primary) hypertension: Secondary | ICD-10-CM

## 2022-10-07 NOTE — Patient Instructions (Signed)
Medication Instructions:  Continue current medications  *If you need a refill on your cardiac medications before your next appointment, please call your pharmacy*   Lab Work: None Ordered   Testing/Procedures: None Ordered   Follow-Up: At [facility], you and your health needs are our priority.  As part of our continuing mission to provide you with exceptional heart care, we have created designated Provider Care Teams.  These Care Teams include your primary Cardiologist (physician) and Advanced Practice Providers (APPs -  Physician Assistants and Nurse Practitioners) who all work together to provide you with the care you need, when you need it.  We recommend signing up for the patient portal called "MyChart".  Sign up information is provided on this After Visit Summary.  MyChart is used to connect with patients for Virtual Visits (Telemedicine).  Patients are able to view lab/test results, encounter notes, upcoming appointments, etc.  Non-urgent messages can be sent to your provider as well.   To learn more about what you can do with MyChart, go to [email or web address].    Your next appointment:   1 year(s)  Provider:   Peter Martinique MD   Other Instructions

## 2022-11-07 DIAGNOSIS — D225 Melanocytic nevi of trunk: Secondary | ICD-10-CM | POA: Diagnosis not present

## 2022-11-07 DIAGNOSIS — L821 Other seborrheic keratosis: Secondary | ICD-10-CM | POA: Diagnosis not present

## 2022-11-07 DIAGNOSIS — Z85820 Personal history of malignant melanoma of skin: Secondary | ICD-10-CM | POA: Diagnosis not present

## 2022-11-07 DIAGNOSIS — Z85828 Personal history of other malignant neoplasm of skin: Secondary | ICD-10-CM | POA: Diagnosis not present

## 2022-11-07 DIAGNOSIS — L570 Actinic keratosis: Secondary | ICD-10-CM | POA: Diagnosis not present

## 2022-12-22 ENCOUNTER — Ambulatory Visit
Admission: RE | Admit: 2022-12-22 | Discharge: 2022-12-22 | Disposition: A | Discharge status: Home / Self Care | Payer: Medicare HMO | Source: Ambulatory Visit | Attending: Family Medicine | Admitting: Family Medicine

## 2022-12-22 DIAGNOSIS — Z780 Asymptomatic menopausal state: Secondary | ICD-10-CM | POA: Diagnosis not present

## 2022-12-22 DIAGNOSIS — M8589 Other specified disorders of bone density and structure, multiple sites: Secondary | ICD-10-CM | POA: Diagnosis not present

## 2022-12-22 DIAGNOSIS — M810 Age-related osteoporosis without current pathological fracture: Secondary | ICD-10-CM

## 2023-06-05 DIAGNOSIS — H35371 Puckering of macula, right eye: Secondary | ICD-10-CM | POA: Diagnosis not present

## 2023-06-05 DIAGNOSIS — H40013 Open angle with borderline findings, low risk, bilateral: Secondary | ICD-10-CM | POA: Diagnosis not present

## 2023-06-05 DIAGNOSIS — H524 Presbyopia: Secondary | ICD-10-CM | POA: Diagnosis not present

## 2023-06-05 DIAGNOSIS — H35033 Hypertensive retinopathy, bilateral: Secondary | ICD-10-CM | POA: Diagnosis not present

## 2023-06-05 DIAGNOSIS — H0102B Squamous blepharitis left eye, upper and lower eyelids: Secondary | ICD-10-CM | POA: Diagnosis not present

## 2023-07-19 DIAGNOSIS — I10 Essential (primary) hypertension: Secondary | ICD-10-CM | POA: Diagnosis not present

## 2023-07-19 DIAGNOSIS — Z0000 Encounter for general adult medical examination without abnormal findings: Secondary | ICD-10-CM | POA: Diagnosis not present

## 2023-07-19 DIAGNOSIS — Z23 Encounter for immunization: Secondary | ICD-10-CM | POA: Diagnosis not present

## 2023-07-19 DIAGNOSIS — I350 Nonrheumatic aortic (valve) stenosis: Secondary | ICD-10-CM | POA: Diagnosis not present

## 2023-08-02 ENCOUNTER — Other Ambulatory Visit: Payer: Self-pay | Admitting: Family Medicine

## 2023-08-02 DIAGNOSIS — Z1231 Encounter for screening mammogram for malignant neoplasm of breast: Secondary | ICD-10-CM

## 2023-08-31 ENCOUNTER — Ambulatory Visit
Admission: RE | Admit: 2023-08-31 | Discharge: 2023-08-31 | Disposition: A | Discharge status: Home / Self Care | Payer: Medicare HMO | Source: Ambulatory Visit | Attending: Family Medicine | Admitting: Family Medicine

## 2023-08-31 DIAGNOSIS — Z1231 Encounter for screening mammogram for malignant neoplasm of breast: Secondary | ICD-10-CM | POA: Diagnosis not present

## 2023-09-28 NOTE — Progress Notes (Signed)
Cardiology Office Note   Date:  10/02/2023   ID:  Whitney, West 19-Sep-1940, MRN [id number]  PCP:  Richmond Campbell., PA-C  Cardiologist:   Kenijah Benningfield [place], MD   No chief complaint on file.     History of Present Illness: Whitney West is a 83 y.o. female who is seen for follow up  of a cardiac murmur. She states she was told she had a heart murmur years ago but nothing was made of it. In 2018 noted to have a murmur on exam.   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 Normal LV function. Repeat in Oct 2023 was stable.  On follow up today she is doing ok. Husband is in a SNF due to prior stroke. She lives alone. Denies any chest pain or dyspnea. No edema or palpitations.   Past Medical History:  Diagnosis Date   Fibromyalgia    Heart murmur    Hypercholesterolemia    Melanoma (HCC)    Osteopenia     Past Surgical History:  Procedure Laterality Date   ABDOMINAL HYSTERECTOMY     bunions     CATARACT EXTRACTION     MELANOMA EXCISION       Current Outpatient Medications  Medication Sig Dispense Refill   Multiple Vitamin (MULTIVITAMIN) tablet Take 1 tablet by mouth daily.     Multiple Vitamins-Minerals (ANTIOXIDANT PO) Take by mouth daily. LIFEGUARD     Multiple Vitamins-Minerals (EYE SUPPORT PO) Take by mouth daily.     RA Krill Oil 500 MG CAPS Take by mouth daily.     losartan (COZAAR) 50 MG tablet Take 1 tablet (50 mg total) by mouth daily. 90 tablet 3   omega-3 acid ethyl esters (LOVAZA) 1 g capsule Take 2 g by mouth 2 (two) times daily. (Patient not taking: Reported on 10/02/2023)     No current facility-administered medications for this visit.    Allergies:   Patient has no known allergies.    Social History:  The patient  reports that she has never smoked. She has never used smokeless tobacco. She reports that she does not drink alcohol and does not use drugs.   Family History:  The patient's family history includes Bone cancer in her mother;  Cancer in her father; Diabetes in her sister; Hyperlipidemia in her sister.    ROS:  Please see the history of present illness.   Otherwise, review of systems are positive for none.   All other systems are reviewed and negative.    PHYSICAL EXAM: VS:  BP (!) 156/81   Pulse (!) 59   Ht 5\' 5"  (1.651 m)   Wt 148 lb 9.6 oz (67.4 kg)   SpO2 93%   BMI 24.73 kg/m  , BMI Body mass index is 24.73 kg/m. GEN: Well nourished, well developed, in no acute distress  HEENT: normal  Neck: no JVD, carotid bruits, or masses Cardiac: RRR; there is a harsh gr 2/6 systolic murmur heard best in the RUSB radiating to apex and axilla and to carotids. Soft S2. No  rubs, or gallops,no edema  Respiratory:  clear to auscultation bilaterally, normal work of breathing GI: soft, nontender, nondistended, + BS MS: no deformity or atrophy  Skin: warm and dry, no rash Neuro:  Strength and sensation are intact Psych: euthymic mood, full affect   EKG Interpretation Date/Time:  Monday October 02 2023 13:51:11 EST Ventricular Rate:  59 PR Interval:  156 QRS Duration:  88 QT  Interval:  416 QTC Calculation: 411 R Axis:   -37  Text Interpretation: Sinus bradycardia Left axis deviation Moderate voltage criteria for LVH, may be normal variant ( R in aVL , Cornell product ) When compared with ECG of Oct 07, 2022 No significant change was found Confirmed by [place], Amil Bouwman [phone]) on 10/02/2023 1:57:37 PM      Recent Labs: No results found for requested labs within last 365 days.    Lipid Panel No results found for: "CHOL", "TRIG", "HDL", "CHOLHDL", "VLDL", "LDLCALC", "LDLDIRECT"    Wt Readings from Last 3 Encounters:  10/02/23 148 lb 9.6 oz (67.4 kg)  10/07/22 153 lb 9.6 oz (69.7 kg)  10/11/21 154 lb (69.9 kg)      Other studies Reviewed: Additional studies/ records that were reviewed today include:   Labs dated 05/23/17: Cholesterol 239, triglycerides 143, HDL 66, Non HDL 173. LDL 139. CMET normal. Normal  CBC.  Dated 06/24/20: Normal CMET and CBC. Cholesterol 195, triglycerides 145, HDL 54, LDL 130.  Dated 06/30/21: cholesterol 207, triglycerides 117, HDL 59, LDL 139. CMET, CBC normal.  Dated 07/19/23: CMET and CBC normal. Cholesterol 219, triglycerides 102, HDL 58, LDL 139.    Echo 07/11/18: Study Conclusions   - Left ventricle: The cavity size was normal. Systolic function was   normal. The estimated ejection fraction was in the range of 60%   to 65%. Wall motion was normal; there were no regional wall   motion abnormalities. Doppler parameters are consistent with   abnormal left ventricular relaxation (grade 1 diastolic   dysfunction). - Aortic valve: Noncoronary cusp mobility was severely restricted.   There was mild stenosis. There was mild to moderate   regurgitation. Mean gradient (S): 16 mm Hg. Regurgitation   pressure half-time: 446 ms. - Mitral valve: Calcified annulus. There was trivial regurgitation. - Right ventricle: Systolic function was normal. - Atrial septum: No defect or patent foramen ovale was identified. - Tricuspid valve: There was mild regurgitation. - Pulmonic valve: There was no significant regurgitation. - Pulmonary arteries: Systolic pressure was mildly increased. PA   peak pressure: 34 mm Hg (S).   Impressions:   - Normal LV EF with grade 1 diastolic dysfunction. Thickened aortic   valve with fixed noncoronary cusp. Since last echo, aortic   stenosis has progressed from mild to moderate by gradients.   Aortic regurgitation is mild to moderate.  Echo 07/20/20: IMPRESSIONS     1. Left ventricular ejection fraction, by estimation, is 60 to 65%. The  left ventricle has normal function. The left ventricle has no regional  wall motion abnormalities. There is mild left ventricular hypertrophy.  Left ventricular diastolic parameters  are consistent with Grade I diastolic dysfunction (impaired relaxation).   2. Right ventricular systolic function is normal.  The right ventricular  size is normal. There is normal pulmonary artery systolic pressure.   3. The mitral valve is normal in structure. No evidence of mitral valve  regurgitation. No evidence of mitral stenosis. Moderate mitral annular  calcification.   4. The aortic valve has an indeterminant number of cusps. There is severe  calcifcation of the aortic valve. There is severe thickening of the aortic  valve. Aortic valve regurgitation is mild. Mild aortic valve stenosis.  Aortic regurgitation PHT measures   513 msec. Aortic valve area, by VTI measures 1.27 cm. Aortic valve mean  gradient measures 15.5 mmHg. Aortic valve Vmax measures 2.65 m/s.   5. Aortic dilatation noted. There is mild dilatation of  the ascending  aorta, measuring 38 mm.   6. The inferior vena cava is normal in size with greater than 50%  respiratory variability, suggesting right atrial pressure of 3 mmHg.   Comparison(s): No significant change from prior study. Prior images  reviewed side by side. Prior mean AV gradient .   Echo 06/07/22: IMPRESSIONS     1. Calcified aortic valve with fixed noncoronary cusp; mild to moderate  AS (mean gradient 15 mmHg; AVA 1.4 cm2); mild AI.   2. Left ventricular ejection fraction, by estimation, is 60 to 65%. The  left ventricle has normal function. The left ventricle has no regional  wall motion abnormalities. Left ventricular diastolic parameters are  consistent with Grade I diastolic  dysfunction (impaired relaxation). Elevated left atrial pressure.   3. Right ventricular systolic function is normal. The right ventricular  size is normal. There is mildly elevated pulmonary artery systolic  pressure.   4. The mitral valve is normal in structure. Trivial mitral valve  regurgitation. No evidence of mitral stenosis.   5. The aortic valve is calcified. Aortic valve regurgitation is mild.  Mild to moderate aortic valve stenosis.   6. The inferior vena cava is normal in  size with greater than 50%  respiratory variability, suggesting right atrial pressure of 3 mmHg.    ASSESSMENT AND PLAN:  1. Aortic stenosis mild to moderate. No change in exam. She is asymptomatic. No plans for follow up Echo unless there is a change in symptoms or exam. Follow up in one year 2.  HTN.  Poorly controlled. Will increase losartan to 100 mg daily. Recommend she monitor BP at home. If staying high should contact PCP 3. Hypercholesterolemia. LDL 139. Dietary modification     Signed, Shalie Schremp [place], MD  10/02/2023 1:57 PM    [facility] [address] [place], [place], [address] Phone [phone], Fax [phone]

## 2023-10-02 ENCOUNTER — Encounter: Payer: Self-pay | Admitting: Cardiology

## 2023-10-02 ENCOUNTER — Ambulatory Visit: Payer: Medicare HMO | Attending: Cardiology | Admitting: Cardiology

## 2023-10-02 VITALS — BP 156/81 | HR 59 | Ht 65.0 in | Wt 148.6 lb

## 2023-10-02 DIAGNOSIS — I10 Essential (primary) hypertension: Secondary | ICD-10-CM | POA: Diagnosis not present

## 2023-10-02 DIAGNOSIS — I350 Nonrheumatic aortic (valve) stenosis: Secondary | ICD-10-CM

## 2023-10-02 MED ORDER — LOSARTAN POTASSIUM 100 MG PO TABS: 100.0000 mg | ORAL_TABLET | Freq: Every day | ORAL | 3 refills | Status: AC

## 2023-10-02 NOTE — Patient Instructions (Signed)
Medication Instructions:  Increase Losartan to 100 mg daily Continue all other medications *If you need a refill on your cardiac medications before your next appointment, please call your pharmacy*   Lab Work: None ordered   Testing/Procedures: None ordered   Follow-Up: At [facility], you and your health needs are our priority.  As part of our continuing mission to provide you with exceptional heart care, we have created designated Provider Care Teams.  These Care Teams include your primary Cardiologist (physician) and Advanced Practice Providers (APPs -  Physician Assistants and Nurse Practitioners) who all work together to provide you with the care you need, when you need it.  We recommend signing up for the patient portal called "MyChart".  Sign up information is provided on this After Visit Summary.  MyChart is used to connect with patients for Virtual Visits (Telemedicine).  Patients are able to view lab/test results, encounter notes, upcoming appointments, etc.  Non-urgent messages can be sent to your provider as well.   To learn more about what you can do with MyChart, go to [email or web address].    Your next appointment:  1 year    Call in Nov to schedule Feb appointment     Provider:  Dr.Jordan       Monitor blood pressure daily If blood pressure remains elevated call PCP

## 2023-11-28 DIAGNOSIS — L570 Actinic keratosis: Secondary | ICD-10-CM | POA: Diagnosis not present

## 2023-11-28 DIAGNOSIS — L578 Other skin changes due to chronic exposure to nonionizing radiation: Secondary | ICD-10-CM | POA: Diagnosis not present

## 2023-11-28 DIAGNOSIS — Z85820 Personal history of malignant melanoma of skin: Secondary | ICD-10-CM | POA: Diagnosis not present

## 2023-11-28 DIAGNOSIS — L814 Other melanin hyperpigmentation: Secondary | ICD-10-CM | POA: Diagnosis not present

## 2023-11-28 DIAGNOSIS — Z85828 Personal history of other malignant neoplasm of skin: Secondary | ICD-10-CM | POA: Diagnosis not present

## 2023-11-28 DIAGNOSIS — L821 Other seborrheic keratosis: Secondary | ICD-10-CM | POA: Diagnosis not present

## 2023-11-28 DIAGNOSIS — D225 Melanocytic nevi of trunk: Secondary | ICD-10-CM | POA: Diagnosis not present

## 2024-06-10 DIAGNOSIS — H35033 Hypertensive retinopathy, bilateral: Secondary | ICD-10-CM | POA: Diagnosis not present

## 2024-06-10 DIAGNOSIS — H35371 Puckering of macula, right eye: Secondary | ICD-10-CM | POA: Diagnosis not present

## 2024-06-10 DIAGNOSIS — H40013 Open angle with borderline findings, low risk, bilateral: Secondary | ICD-10-CM | POA: Diagnosis not present

## 2024-06-10 DIAGNOSIS — Z961 Presence of intraocular lens: Secondary | ICD-10-CM | POA: Diagnosis not present

## 2024-07-23 DIAGNOSIS — Z0000 Encounter for general adult medical examination without abnormal findings: Secondary | ICD-10-CM | POA: Diagnosis not present

## 2024-07-23 DIAGNOSIS — Z23 Encounter for immunization: Secondary | ICD-10-CM | POA: Diagnosis not present

## 2024-07-23 DIAGNOSIS — I350 Nonrheumatic aortic (valve) stenosis: Secondary | ICD-10-CM | POA: Diagnosis not present

## 2024-07-23 DIAGNOSIS — I10 Essential (primary) hypertension: Secondary | ICD-10-CM | POA: Diagnosis not present

## 2024-09-05 ENCOUNTER — Other Ambulatory Visit: Payer: Self-pay | Admitting: Family Medicine

## 2024-09-05 DIAGNOSIS — Z1231 Encounter for screening mammogram for malignant neoplasm of breast: Secondary | ICD-10-CM

## 2024-09-06 ENCOUNTER — Ambulatory Visit
Admission: RE | Admit: 2024-09-06 | Discharge: 2024-09-06 | Disposition: A | Source: Ambulatory Visit | Attending: Family Medicine | Admitting: Family Medicine

## 2024-09-06 DIAGNOSIS — Z1231 Encounter for screening mammogram for malignant neoplasm of breast: Secondary | ICD-10-CM

## 2024-10-02 ENCOUNTER — Ambulatory Visit: Admitting: Cardiology

## 2024-10-31 ENCOUNTER — Ambulatory Visit: Admitting: Cardiology
